# Patient Record
Sex: Male | Born: 1965
Health system: Southern US, Community
[De-identification: ages and names within clinical notes are randomized; demographics above are authoritative.]

## PROBLEM LIST (undated history)

## (undated) DIAGNOSIS — Z86010 Personal history of colonic polyps: Secondary | ICD-10-CM

## (undated) DIAGNOSIS — I10 Essential (primary) hypertension: Secondary | ICD-10-CM

## (undated) DIAGNOSIS — E785 Hyperlipidemia, unspecified: Secondary | ICD-10-CM

## (undated) DIAGNOSIS — T7840XA Allergy, unspecified, initial encounter: Secondary | ICD-10-CM

## (undated) DIAGNOSIS — B1910 Unspecified viral hepatitis B without hepatic coma: Secondary | ICD-10-CM

## (undated) DIAGNOSIS — F329 Major depressive disorder, single episode, unspecified: Secondary | ICD-10-CM

## (undated) DIAGNOSIS — B019 Varicella without complication: Secondary | ICD-10-CM

## (undated) DIAGNOSIS — R7989 Other specified abnormal findings of blood chemistry: Secondary | ICD-10-CM

## (undated) DIAGNOSIS — M5430 Sciatica, unspecified side: Secondary | ICD-10-CM

## (undated) DIAGNOSIS — R945 Abnormal results of liver function studies: Secondary | ICD-10-CM

## (undated) DIAGNOSIS — E669 Obesity, unspecified: Secondary | ICD-10-CM

## (undated) DIAGNOSIS — S3992XA Unspecified injury of lower back, initial encounter: Secondary | ICD-10-CM

## (undated) HISTORY — DX: Hyperlipidemia, unspecified: E78.5

## (undated) HISTORY — DX: Obesity, unspecified: E66.9

## (undated) HISTORY — PX: MOUTH SURGERY: SHX715

## (undated) HISTORY — DX: Major depressive disorder, single episode, unspecified: F32.9

## (undated) HISTORY — DX: Unspecified injury of lower back, initial encounter: S39.92XA

## (undated) HISTORY — PX: POLYPECTOMY: SHX149

## (undated) HISTORY — DX: Unspecified viral hepatitis B without hepatic coma: B19.10

## (undated) HISTORY — DX: Other specified abnormal findings of blood chemistry: R79.89

## (undated) HISTORY — DX: Essential (primary) hypertension: I10

## (undated) HISTORY — DX: Varicella without complication: B01.9

## (undated) HISTORY — DX: Sciatica, unspecified side: M54.30

## (undated) HISTORY — PX: COLONOSCOPY: SHX174

## (undated) HISTORY — DX: Abnormal results of liver function studies: R94.5

## (undated) HISTORY — DX: Personal history of colonic polyps: Z86.010

## (undated) HISTORY — DX: Allergy, unspecified, initial encounter: T78.40XA

---

## 1998-05-17 ENCOUNTER — Emergency Department (HOSPITAL_COMMUNITY): Admission: EM | Admit: 1998-05-17 | Discharge: 1998-05-17 | Payer: Self-pay | Admitting: Emergency Medicine

## 1998-12-20 ENCOUNTER — Emergency Department (HOSPITAL_COMMUNITY): Admission: EM | Admit: 1998-12-20 | Discharge: 1998-12-20 | Payer: Self-pay | Admitting: Emergency Medicine

## 2001-01-15 ENCOUNTER — Encounter: Admission: RE | Admit: 2001-01-15 | Discharge: 2001-01-15 | Payer: Self-pay | Admitting: Family Medicine

## 2002-02-06 ENCOUNTER — Ambulatory Visit (HOSPITAL_COMMUNITY): Admission: RE | Admit: 2002-02-06 | Discharge: 2002-02-06 | Payer: Self-pay | Admitting: Internal Medicine

## 2002-02-06 ENCOUNTER — Encounter: Payer: Self-pay | Admitting: Cardiovascular Disease

## 2003-06-07 DIAGNOSIS — F32A Depression, unspecified: Secondary | ICD-10-CM

## 2003-06-07 HISTORY — DX: Depression, unspecified: F32.A

## 2003-10-02 ENCOUNTER — Ambulatory Visit (HOSPITAL_COMMUNITY): Admission: RE | Admit: 2003-10-02 | Discharge: 2003-10-02 | Payer: Self-pay | Admitting: Cardiology

## 2004-06-09 ENCOUNTER — Emergency Department (HOSPITAL_COMMUNITY): Admission: EM | Admit: 2004-06-09 | Discharge: 2004-06-09 | Payer: Self-pay | Admitting: Emergency Medicine

## 2007-08-18 ENCOUNTER — Encounter: Admission: RE | Admit: 2007-08-18 | Discharge: 2007-08-18 | Payer: Self-pay | Admitting: Emergency Medicine

## 2009-04-28 ENCOUNTER — Emergency Department (HOSPITAL_COMMUNITY): Admission: EM | Admit: 2009-04-28 | Discharge: 2009-04-28 | Payer: Self-pay | Admitting: Emergency Medicine

## 2009-05-25 ENCOUNTER — Emergency Department (HOSPITAL_COMMUNITY): Admission: EM | Admit: 2009-05-25 | Discharge: 2009-05-25 | Payer: Self-pay | Admitting: Family Medicine

## 2010-05-26 ENCOUNTER — Emergency Department (HOSPITAL_COMMUNITY)
Admission: EM | Admit: 2010-05-26 | Discharge: 2010-05-26 | Payer: Self-pay | Source: Home / Self Care | Admitting: Emergency Medicine

## 2010-08-13 ENCOUNTER — Other Ambulatory Visit: Payer: Self-pay | Admitting: Internal Medicine

## 2010-08-13 DIAGNOSIS — M545 Low back pain, unspecified: Secondary | ICD-10-CM

## 2010-08-17 ENCOUNTER — Ambulatory Visit
Admission: RE | Admit: 2010-08-17 | Discharge: 2010-08-17 | Disposition: A | Discharge status: Home / Self Care | Payer: BC Managed Care – PPO | Source: Ambulatory Visit | Attending: Internal Medicine | Admitting: Internal Medicine

## 2010-08-17 DIAGNOSIS — M545 Low back pain, unspecified: Secondary | ICD-10-CM

## 2010-10-22 NOTE — Op Note (Signed)
NAMEGRANTLEY, SAVAGE NO.:  [id number]   MEDICAL RECORD NO.:  [id number]                   PATIENT TYPE:  OIB   LOCATION:  2899                                 FACILITY:  [facility]   PHYSICIAN:  Richard A. Alanda Amass, M.D.          DATE OF BIRTH:  11-Jun-1965   DATE OF PROCEDURE:  10/02/2003  DATE OF DISCHARGE:                                 OPERATIVE REPORT   PROCEDURE:  Tilt table test.   This 45 year old single half-pack a day smoker has had a history of  recurrent syncope for many years.  He has had two episodes in the last two  months of syncope and presyncope without seizure activity.  Cardiac workup  by Dr. Jacinto Halim has been negative so far except for mildly diminished ejection  fraction of approximately 50% on Cardiolite and 2 D echo with no significant  valvular disease.  He is referred for tilt table testing.  He is on no  regular medications.   The patient was in the postabsorptive state.  He was admitted as an  outpatient for tilt table testing and was brought to the second floor CP  lab.  Preoperative laboratory including CBC, coagulation studies, TSH, BMP,  were within normal limits.  Outpatient resting EKG showed sinus rhythm with  vertical frontal axis, normal QT, short PR interval, but no delta waves.  Essentially unremarkable for age.   The patient underwent upright tilt table testing at 70 degrees for 30  minutes.  There was no blood pressure drop or bradycardia or symptoms during  this time.   Isuprel infusion was then begun for another 10 minutes.  Blood pressure  increased to 153, heart rate increased to 85-90 at 70 degree tilt.  There  were no symptoms and no hypotension or bradycardia noted.   The patient tolerated the procedure well.   Negative tilt table test including Isuprel provocation as outlined above.                                               Richard A. Alanda Amass, M.D.    RAW/MEDQ  D:  10/02/2003  T:  10/02/2003   Job:  [id number]   cc:   Olene Craven, M.D.  [address]  [address]  [place]  [place] [address]  Fax: [phone]   Cristy Hilts. Jacinto Halim, M.D.  [address]  [place]  [place] [address]  Fax: [phone]

## 2013-01-28 ENCOUNTER — Other Ambulatory Visit: Payer: Self-pay | Admitting: Internal Medicine

## 2013-01-28 DIAGNOSIS — R1011 Right upper quadrant pain: Secondary | ICD-10-CM

## 2013-01-30 ENCOUNTER — Ambulatory Visit
Admission: RE | Admit: 2013-01-30 | Discharge: 2013-01-30 | Disposition: A | Discharge status: Home / Self Care | Payer: BC Managed Care – PPO | Source: Ambulatory Visit | Attending: Internal Medicine | Admitting: Internal Medicine

## 2013-01-30 DIAGNOSIS — R1011 Right upper quadrant pain: Secondary | ICD-10-CM

## 2013-03-07 ENCOUNTER — Encounter: Payer: Self-pay | Admitting: Internal Medicine

## 2013-03-11 ENCOUNTER — Ambulatory Visit (INDEPENDENT_AMBULATORY_CARE_PROVIDER_SITE_OTHER): Payer: No Typology Code available for payment source | Admitting: Internal Medicine

## 2013-03-11 ENCOUNTER — Encounter: Payer: Self-pay | Admitting: Internal Medicine

## 2013-03-11 VITALS — BP 110/80 | HR 68 | Ht 75.0 in | Wt 253.1 lb

## 2013-03-11 DIAGNOSIS — Z860100 Personal history of colon polyps, unspecified: Secondary | ICD-10-CM

## 2013-03-11 DIAGNOSIS — R7401 Elevation of levels of liver transaminase levels: Secondary | ICD-10-CM

## 2013-03-11 DIAGNOSIS — R748 Abnormal levels of other serum enzymes: Secondary | ICD-10-CM

## 2013-03-11 DIAGNOSIS — R195 Other fecal abnormalities: Secondary | ICD-10-CM

## 2013-03-11 HISTORY — DX: Personal history of colon polyps, unspecified: Z86.0100

## 2013-03-11 HISTORY — DX: Personal history of colonic polyps: Z86.010

## 2013-03-11 MED ORDER — NA SULFATE-K SULFATE-MG SULF 17.5-3.13-1.6 GM/177ML PO SOLN: ORAL | Status: DC

## 2013-03-11 NOTE — Assessment & Plan Note (Addendum)
[place] 01/2013 NL liver - not echogenic, NL GB and CBD Dr. Waynard Edwards suspects fatty liver as LFT's decreased markedly w/ wgt loss in past - he has ordered chronic hepatitis panel  This is appropriate - is important to f/u on that I would have him screened with an ANA also - he was sent for heme + stool and will f/u Dr. Waynard Edwards about this - I did explain that he should not overuse EtOH Would continue statin (consider checking CPK also)

## 2013-03-11 NOTE — Patient Instructions (Addendum)

## 2013-03-11 NOTE — Progress Notes (Signed)
  Subjective:    Patient ID: Javier Hart, male    DOB: 02-17-1966, 47 y.o.   MRN: [id number]  HPI The patient is here for evaluation of heme positive stool. His primary care physician, Dr. Waynard Edwards, had him do immune fecal occult blood testing and it came back positive this summer. He does not notice any types of bleeding or any problems like that. His other GI history is notable for abnormal transaminases. Dr. Waynard Edwards has been following this, apparently had a markedly reduction in transaminases last year when he lost weight. Allergies  Allergen Reactions  . Penicillins Anaphylaxis    As a child   Outpatient Prescriptions Prior to Visit  Medication Sig Dispense Refill  . Aluminum Chloride (DRYSOL EX) Apply topically. Liquid applied 2 times a month for hyperhidrosis.      . Cholecalciferol (VITAMIN D) 2000 UNITS CAPS Take by mouth.      . cyclobenzaprine (FLEXERIL) 10 MG tablet Take 10 mg by mouth 3 (three) times daily as needed for muscle spasms.      . rosuvastatin (CRESTOR) 20 MG tablet Take 20 mg by mouth daily.      Marland Kitchen zolpidem (AMBIEN) 10 MG tablet Take 10 mg by mouth at bedtime as needed for sleep.       No facility-administered medications prior to visit.   Past Medical History  Diagnosis Date  . Back injury   . Abnormal liver function test   . Depression 2005  . Sciatica     LLE  . Chicken pox     twice  . Hyperlipidemia   . Obesity    History reviewed. No pertinent past surgical history. History   Social History  . Marital Status: Single    Spouse Name: N/A    Number of Children: N/A  . Years of Education: N/A   Social History Main Topics  . Smoking status: Former Games developer  . Smokeless tobacco: Never Used  . Alcohol Use: Yes     Comment: wine and brown liquors in moderation  . 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 Brother is in internal medicine physician in [place].   No  children.   Up to one alcoholic beverage a day, 2 caffeinated beverages daily   Family History  Problem Relation Age of Onset  . Lung cancer Father   . Prostate cancer Father   . Breast cancer Mother     metastatic  . Bladder Cancer Brother   . Colon cancer Neg Hx   . Alcoholism Father     Review of Systems All other ROS negative or as per HPI    Objective:   Physical Exam General:  NAD Eyes:   anicteric Lungs:  clear Heart:  S1S2 no rubs, murmurs or gallops Abdomen:  soft and nontender, BS+, no HSM/mass  Data Reviewed:  PCP notes 01/2013 AST 70 (7-45) and ALT 119 (5-40) 01/09/13 PLT 177, WBC 5.1 TG 104    Assessment & Plan:   Heme + stool- iFOBT+   Abnormal transaminases

## 2013-03-11 NOTE — Assessment & Plan Note (Signed)
Colonoscopy is appropriate to investigate for cause of heme + stool in this setting. The risks and benefits as well as alternatives of endoscopic procedure(s) have been discussed and reviewed. All questions answered. The patient agrees to proceed.

## 2013-03-12 ENCOUNTER — Encounter: Payer: Self-pay | Admitting: Internal Medicine

## 2013-03-13 ENCOUNTER — Telehealth: Payer: Self-pay

## 2013-03-13 NOTE — Telephone Encounter (Signed)
Got a drug change request from [facility] saying suprep not available from the mfg.  Located a suprep sample kit , put up front for pick up and patient informed.  Spoke to [employer] the [employer] Rep. He spoke to pharmacy and they were not running it thru correctly, he will help them get this corrected.

## 2013-03-26 ENCOUNTER — Encounter: Payer: Self-pay | Admitting: Internal Medicine

## 2013-03-26 ENCOUNTER — Ambulatory Visit (AMBULATORY_SURGERY_CENTER): Payer: No Typology Code available for payment source | Admitting: Internal Medicine

## 2013-03-26 VITALS — BP 113/65 | HR 50 | Temp 97.5°F | Resp 18 | Ht 75.0 in | Wt 253.0 lb

## 2013-03-26 DIAGNOSIS — D126 Benign neoplasm of colon, unspecified: Secondary | ICD-10-CM

## 2013-03-26 DIAGNOSIS — R195 Other fecal abnormalities: Secondary | ICD-10-CM

## 2013-03-26 DIAGNOSIS — K648 Other hemorrhoids: Secondary | ICD-10-CM

## 2013-03-26 MED ORDER — SODIUM CHLORIDE 0.9 % IV SOLN: 500.0000 mL | INTRAVENOUS | Status: DC

## 2013-03-26 NOTE — Patient Instructions (Addendum)
I found and removed one small polyp that looks benign. You have internal hemorrhoids and I suspect this is what caused the microscopic blood in the stool.  I will let you know pathology results and when to have another routine colonoscopy by mail.  I recommend that you not do annual tests for blood in the stool given that you will likely be having routine colonoscopy.  It is the time of year to have a vaccination to prevent the flu (influenza virus). Please have this done through your primary care provider or you can get this done at local pharmacies or the [facility]. It would be very helpful if you notify your primary care provider when and where you had the vaccination given by messaging them in My Chart, leaving a message or faxing the information.  I appreciate the opportunity to care for you. Iva Boop, MD, FACG        YOU HAD AN ENDOSCOPIC PROCEDURE TODAY AT THE [facility]: Refer to the procedure report that was given to you for any specific questions about what was found during the examination.  If the procedure report does not answer your questions, please call your gastroenterologist to clarify.  If you requested that your care partner not be given the details of your procedure findings, then the procedure report has been included in a sealed envelope for you to review at your convenience later.  YOU SHOULD EXPECT: Some feelings of bloating in the abdomen. Passage of more gas than usual.  Walking can help get rid of the air that was put into your GI tract during the procedure and reduce the bloating. If you had a lower endoscopy (such as a colonoscopy or flexible sigmoidoscopy) you may notice spotting of blood in your stool or on the toilet paper. If you underwent a bowel prep for your procedure, then you may not have a normal bowel movement for a few days.  DIET: Your first meal following the procedure should be a light meal and then it is ok to progress to  your normal diet.  A half-sandwich or bowl of soup is an example of a good first meal.  Heavy or fried foods are harder to digest and may make you feel nauseous or bloated.  Likewise meals heavy in dairy and vegetables can cause extra gas to form and this can also increase the bloating.  Drink plenty of fluids but you should avoid alcoholic beverages for 24 hours.  ACTIVITY: Your care partner should take you home directly after the procedure.  You should plan to take it easy, moving slowly for the rest of the day.  You can resume normal activity the day after the procedure however you should NOT DRIVE or use heavy machinery for 24 hours (because of the sedation medicines used during the test).    SYMPTOMS TO REPORT IMMEDIATELY: A gastroenterologist can be reached at any hour.  During normal business hours, 8:30 AM to 5:00 PM Monday through Friday, call [phone].  After hours and on weekends, please call the GI answering service at [phone] who will take a message and have the physician on call contact you.   Following lower endoscopy (colonoscopy or flexible sigmoidoscopy):  Excessive amounts of blood in the stool  Significant tenderness or worsening of abdominal pains  Swelling of the abdomen that is new, acute  Fever of 100F or higher   FOLLOW UP: If any biopsies were taken you will be contacted by phone  or by letter within the next 1-3 weeks.  Call your gastroenterologist if you have not heard about the biopsies in 3 weeks.  Our staff will call the home number listed on your records the next business day following your procedure to check on you and address any questions or concerns that you may have at that time regarding the information given to you following your procedure. This is a courtesy call and so if there is no answer at the home number and we have not heard from you through the emergency physician on call, we will assume that you have returned to your regular daily  activities without incident.  SIGNATURES/CONFIDENTIALITY: You and/or your care partner have signed paperwork which will be entered into your electronic medical record.  These signatures attest to the fact that that the information above on your After Visit Summary has been reviewed and is understood.  Full responsibility of the confidentiality of this discharge information lies with you and/or your care-partner.    Resume medications. Information given on polyps and hemorrhoids with discharge instructions.

## 2013-03-26 NOTE — Op Note (Signed)
[facility] [address] [place], [address]   COLONOSCOPY PROCEDURE REPORT  PATIENT: Javier, Hart  MR#: [id number] BIRTHDATE: August 06, 1965 , 47  yrs. old GENDER: Male ENDOSCOPIST: Iva Boop, MD, [facility] REFERRED HY:QMVH Waynard Edwards, M.D. PROCEDURE DATE:  03/26/2013 PROCEDURE:   Colonoscopy with snare polypectomy First Screening Colonoscopy - Avg.  risk and is 50 yrs.  old or older - No.  Prior Negative Screening - Now for repeat screening. N/A  History of Adenoma - Now for follow-up colonoscopy & has been > or = to 3 yrs.  N/A  Polyps Removed Today? Yes. ASA CLASS:   Class II INDICATIONS:heme-positive stool.   IFOBT MEDICATIONS: propofol (Diprivan) 300mg  IV, MAC sedation, administered by CRNA, and These medications were titrated to patient response per physician's verbal order  DESCRIPTION OF PROCEDURE:   After the risks benefits and alternatives of the procedure were thoroughly explained, informed consent was obtained.  A digital rectal exam revealed no abnormalities of the rectum, A digital rectal exam revealed no prostatic nodules, and A digital rectal exam revealed the prostate was not enlarged.   The LB [id number]  endoscope was introduced through the anus and advanced to the cecum, which was identified by both the appendix and ileocecal valve. No adverse events experienced.   The quality of the prep was excellent using Suprep  The instrument was then slowly withdrawn as the colon was fully examined.   COLON FINDINGS: A sessile polyp measuring 6 mm in size was found in the transverse colon.  A polypectomy was performed with a cold snare.  The resection was complete and the polyp tissue was completely retrieved.   The colon mucosa was otherwise normal.   A right colon retroflexion was performed.  Retroflexed views revealed internal hemorrhoids. The time to cecum=2 minutes 52 seconds. Withdrawal time=8 minutes 07 seconds.  The scope was withdrawn and the  procedure completed. COMPLICATIONS: There were no complications.  ENDOSCOPIC IMPRESSION: 1.   Sessile polyp measuring 6 mm in size was found in the transverse colon; polypectomy was performed with a cold snare 2.   The colon mucosa was otherwise normal - excellent prep 3.   Internal hemorrhoids  RECOMMENDATIONS: 1.  Timing of repeat colonoscopy will be determined by pathology findings. 2.   Avoid routine hemoccults since he has had colonoscopy.   eSigned:  Iva Boop, MD, Clementeen Graham 03/26/2013 2:23 PM  cc: Rodrigo Ran, MD and The Patient

## 2013-03-26 NOTE — Progress Notes (Signed)
Called to room to assist during endoscopic procedure.  Patient ID and intended procedure confirmed with present staff. Received instructions for my participation in the procedure from the performing physician.  

## 2013-03-26 NOTE — Progress Notes (Signed)
No egg or soy allergy. ewm 

## 2013-03-26 NOTE — Progress Notes (Signed)
Patient did not experience any of the following events: a burn prior to discharge; a fall within the facility; wrong site/side/patient/procedure/implant event; or a hospital transfer or hospital admission upon discharge from the facility. (G8907) Patient did not have preoperative order for IV antibiotic SSI prophylaxis. (G8918)  

## 2013-03-27 ENCOUNTER — Telehealth: Payer: Self-pay | Admitting: *Deleted

## 2013-03-27 NOTE — Telephone Encounter (Signed)
Left message on number given in admitting to return call if questions or problems. ewm 

## 2013-04-05 ENCOUNTER — Encounter: Payer: Self-pay | Admitting: Internal Medicine

## 2013-04-05 NOTE — Progress Notes (Signed)
Quick Note:  6 mm tubular adenoma - repeat colonoscopy 2019 ______

## 2013-04-11 ENCOUNTER — Other Ambulatory Visit: Payer: Self-pay

## 2014-03-21 ENCOUNTER — Other Ambulatory Visit: Payer: Self-pay

## 2014-08-06 IMAGING — US US ABDOMEN COMPLETE
1 series · 14 of 25 positions shown · non-contrast
Comparison: None.

CLINICAL DATA: Right upper quadrant abdominal pain.

COMPLETE ABDOMINAL ULTRASOUND

[Series 1: us abdomen complete · 0.31mm/px · 14 of 67 slices shown]
[im 1/67]
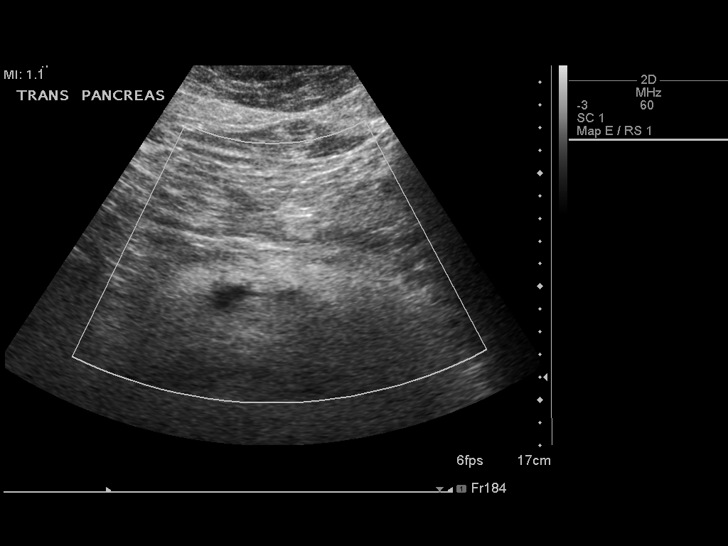
[im 6/67]
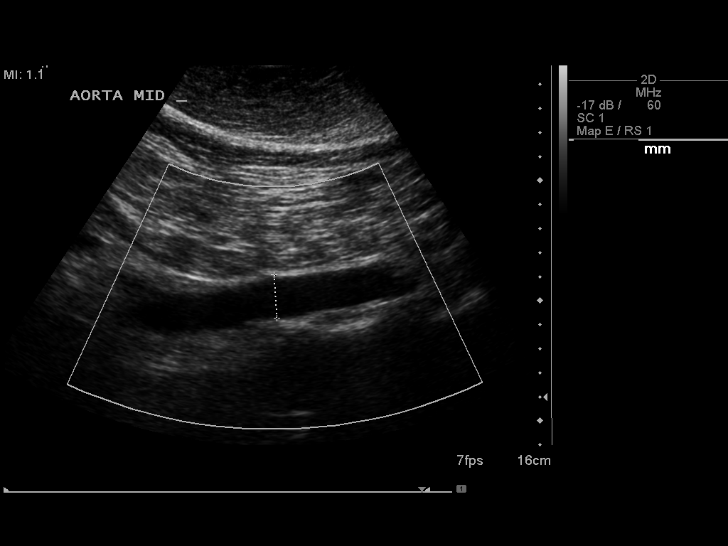
[im 12/67]
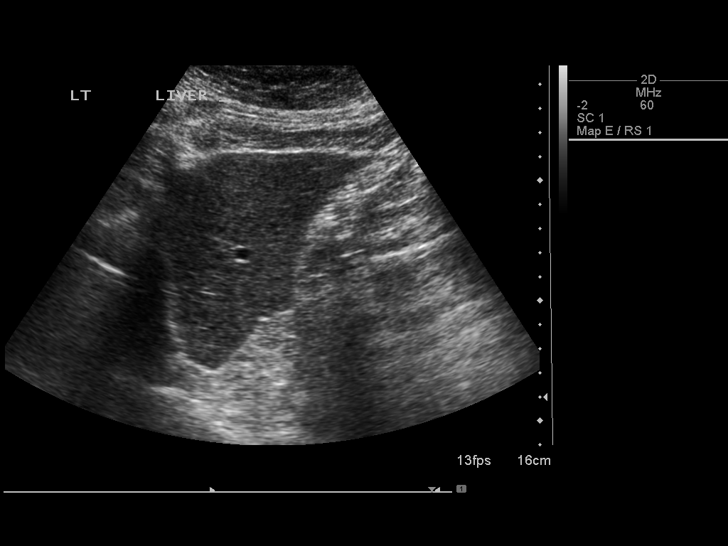
[im 17/67]
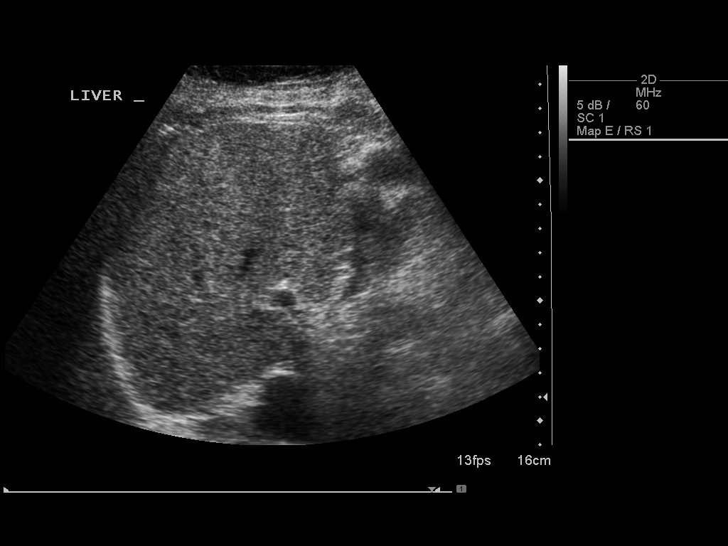
[im 23/67]
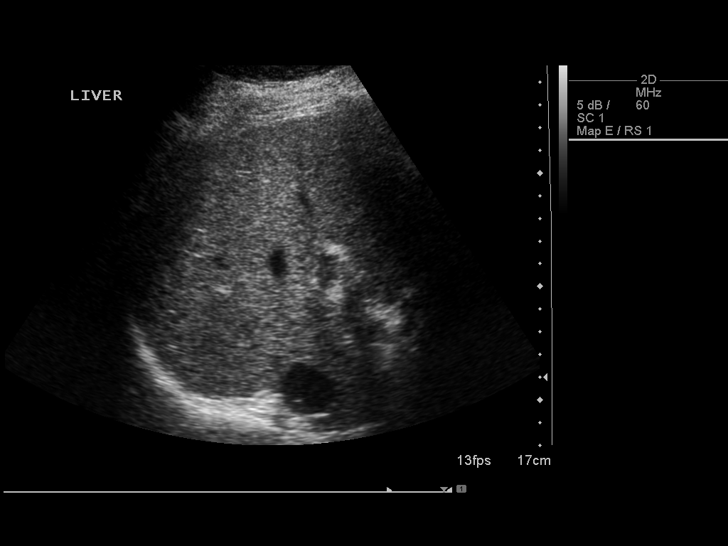
[im 25/67]
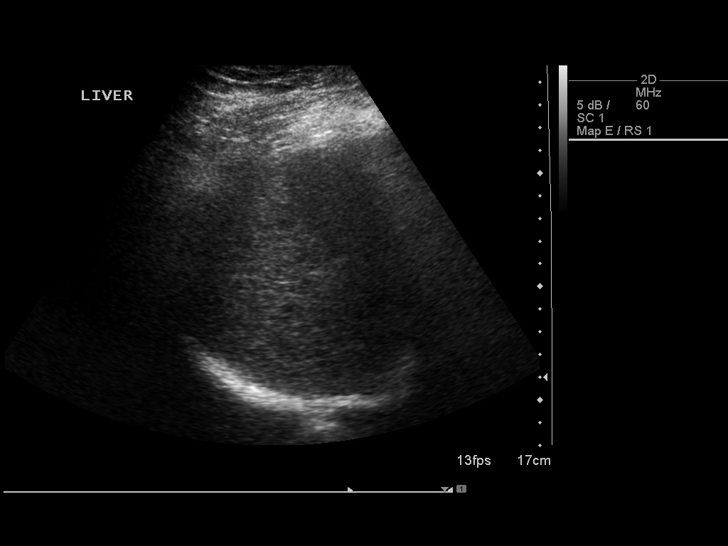
[im 31/67]
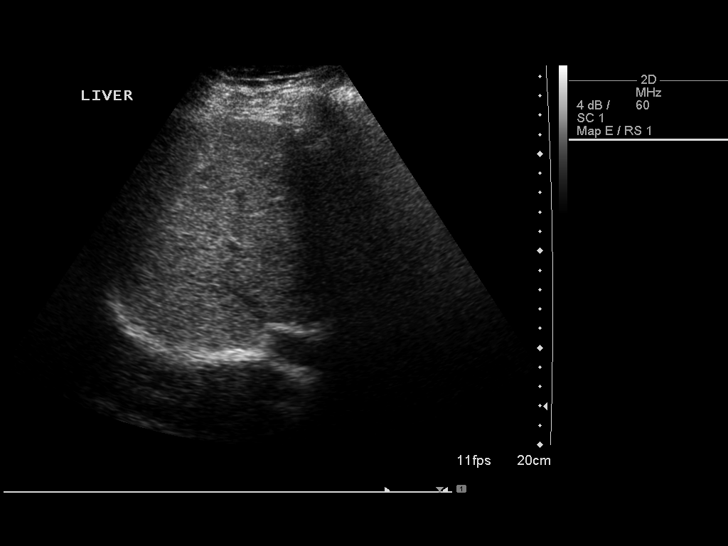
[im 36/67]
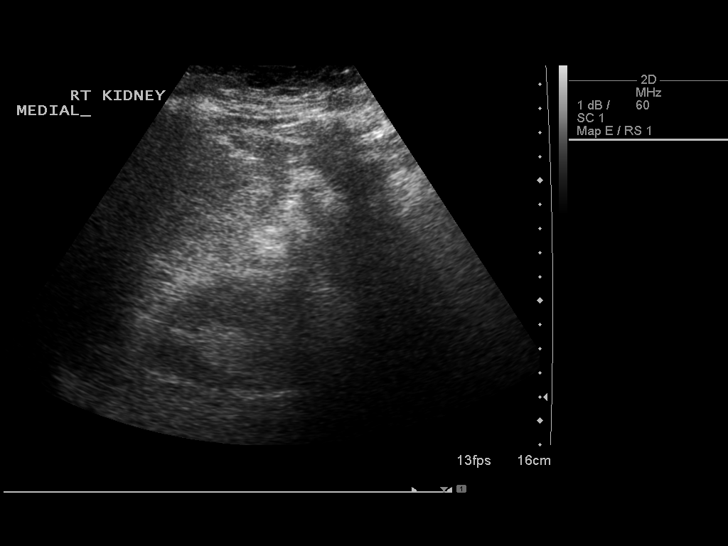
[im 42/67]
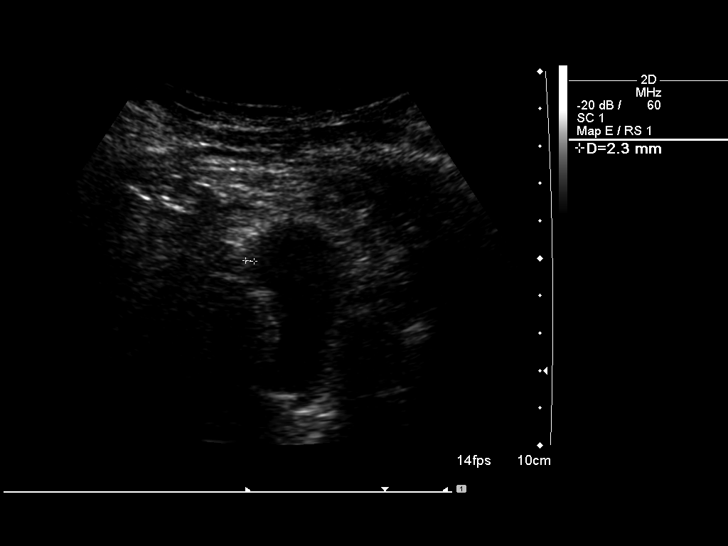
[im 45/67]
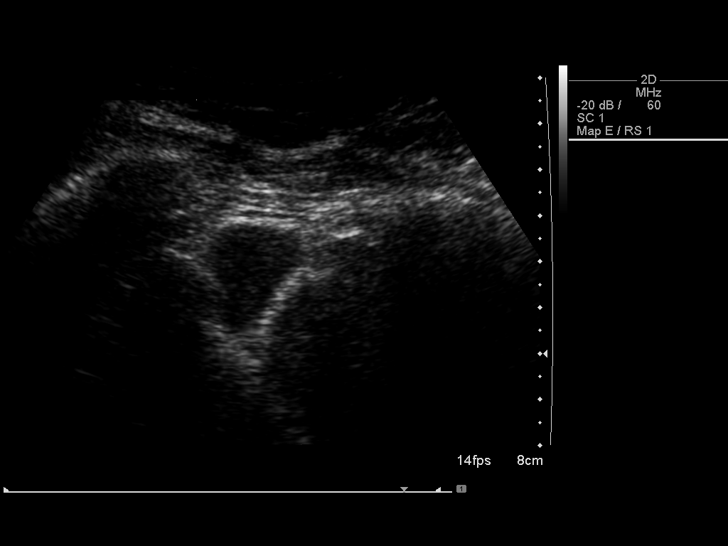
[im 50/67]
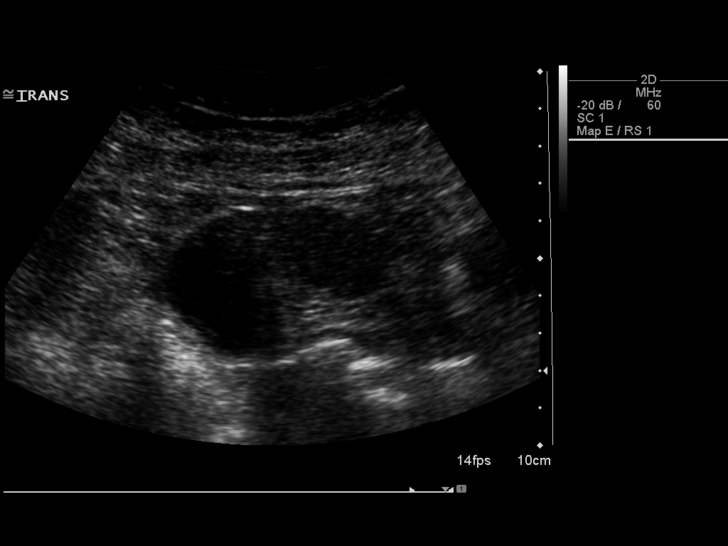
[im 56/67]
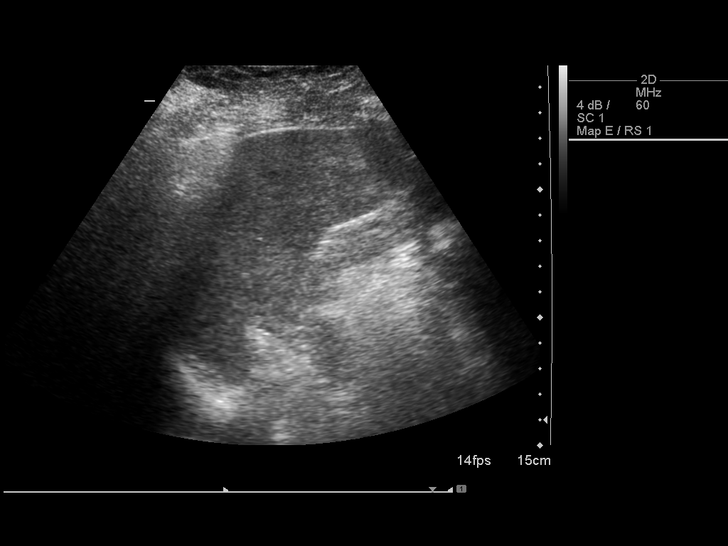
[im 61/67]
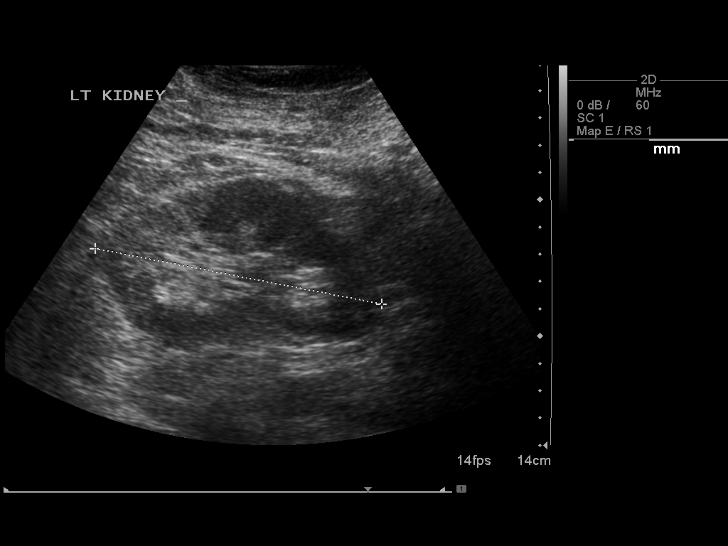
[im 67/67]
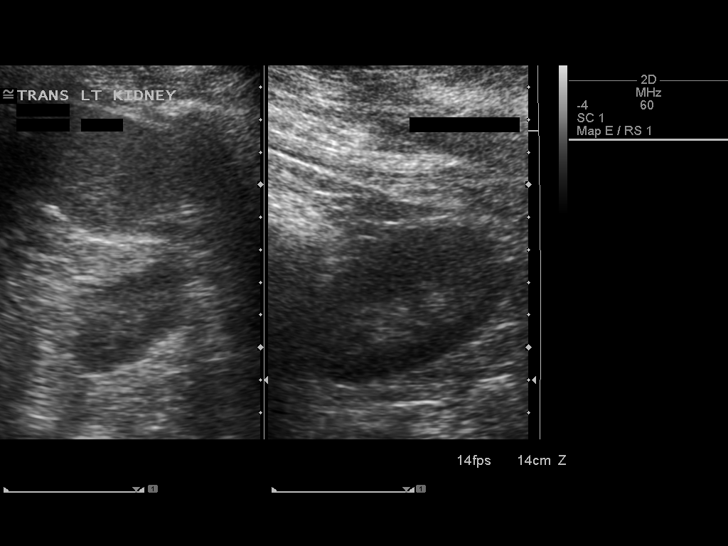

[14 of 25 positions shown; findings below may reference images not displayed]

FINDINGS: Gallbladder:  normal, without stone, wall thickening, or
      pericholecystic fluid.  Sonographic Murphy's sign was not
      elicited.

      Common bile duct:  normal, at 2 mm

      Liver:  normal in echogenicity without focal lesion.

      IVC:  within normal limits.

      Pancreas:  normal. Pancreatic tail not well visualized.

      Spleen:  normal in size and echotexture.

      Right Kidney:  11.4 cm.  No hydronephrosis.

      Left Kidney:  10.7 cm.  No hydronephrosis.

Abdominal aorta:  Non aneurysmal without ascites.
IMPRESSION: Normal abdominal ultrasound; no explanation for right upper
quadrant pain.

## 2017-03-09 DIAGNOSIS — M9901 Segmental and somatic dysfunction of cervical region: Secondary | ICD-10-CM | POA: Diagnosis not present

## 2017-03-09 DIAGNOSIS — M5432 Sciatica, left side: Secondary | ICD-10-CM | POA: Diagnosis not present

## 2017-03-09 DIAGNOSIS — M4716 Other spondylosis with myelopathy, lumbar region: Secondary | ICD-10-CM | POA: Diagnosis not present

## 2017-03-09 DIAGNOSIS — M9903 Segmental and somatic dysfunction of lumbar region: Secondary | ICD-10-CM | POA: Diagnosis not present

## 2017-04-06 DIAGNOSIS — M5432 Sciatica, left side: Secondary | ICD-10-CM | POA: Diagnosis not present

## 2017-04-06 DIAGNOSIS — M9901 Segmental and somatic dysfunction of cervical region: Secondary | ICD-10-CM | POA: Diagnosis not present

## 2017-04-06 DIAGNOSIS — M9903 Segmental and somatic dysfunction of lumbar region: Secondary | ICD-10-CM | POA: Diagnosis not present

## 2017-04-06 DIAGNOSIS — M4716 Other spondylosis with myelopathy, lumbar region: Secondary | ICD-10-CM | POA: Diagnosis not present

## 2017-04-18 DIAGNOSIS — H5203 Hypermetropia, bilateral: Secondary | ICD-10-CM | POA: Diagnosis not present

## 2017-04-18 DIAGNOSIS — H524 Presbyopia: Secondary | ICD-10-CM | POA: Diagnosis not present

## 2017-04-18 DIAGNOSIS — H04123 Dry eye syndrome of bilateral lacrimal glands: Secondary | ICD-10-CM | POA: Diagnosis not present

## 2017-05-11 DIAGNOSIS — M9901 Segmental and somatic dysfunction of cervical region: Secondary | ICD-10-CM | POA: Diagnosis not present

## 2017-05-11 DIAGNOSIS — M4716 Other spondylosis with myelopathy, lumbar region: Secondary | ICD-10-CM | POA: Diagnosis not present

## 2017-05-11 DIAGNOSIS — M5432 Sciatica, left side: Secondary | ICD-10-CM | POA: Diagnosis not present

## 2017-05-11 DIAGNOSIS — M9903 Segmental and somatic dysfunction of lumbar region: Secondary | ICD-10-CM | POA: Diagnosis not present

## 2017-07-07 DIAGNOSIS — R82998 Other abnormal findings in urine: Secondary | ICD-10-CM | POA: Diagnosis not present

## 2017-07-12 DIAGNOSIS — Z1212 Encounter for screening for malignant neoplasm of rectum: Secondary | ICD-10-CM | POA: Diagnosis not present

## 2017-07-14 DIAGNOSIS — L918 Other hypertrophic disorders of the skin: Secondary | ICD-10-CM | POA: Diagnosis not present

## 2017-07-14 DIAGNOSIS — R7301 Impaired fasting glucose: Secondary | ICD-10-CM | POA: Diagnosis not present

## 2017-07-14 DIAGNOSIS — Z0000 Encounter for general adult medical examination without abnormal findings: Secondary | ICD-10-CM | POA: Diagnosis not present

## 2017-07-14 DIAGNOSIS — E668 Other obesity: Secondary | ICD-10-CM | POA: Diagnosis not present

## 2017-07-14 DIAGNOSIS — R945 Abnormal results of liver function studies: Secondary | ICD-10-CM | POA: Diagnosis not present

## 2017-07-14 DIAGNOSIS — B1910 Unspecified viral hepatitis B without hepatic coma: Secondary | ICD-10-CM | POA: Diagnosis not present

## 2017-07-14 DIAGNOSIS — E7849 Other hyperlipidemia: Secondary | ICD-10-CM | POA: Diagnosis not present

## 2017-07-14 DIAGNOSIS — I10 Essential (primary) hypertension: Secondary | ICD-10-CM | POA: Diagnosis not present

## 2017-07-14 DIAGNOSIS — L308 Other specified dermatitis: Secondary | ICD-10-CM | POA: Diagnosis not present

## 2017-07-14 DIAGNOSIS — M205X9 Other deformities of toe(s) (acquired), unspecified foot: Secondary | ICD-10-CM | POA: Diagnosis not present

## 2017-07-19 DIAGNOSIS — M5432 Sciatica, left side: Secondary | ICD-10-CM | POA: Diagnosis not present

## 2017-07-19 DIAGNOSIS — M9901 Segmental and somatic dysfunction of cervical region: Secondary | ICD-10-CM | POA: Diagnosis not present

## 2017-07-19 DIAGNOSIS — M9903 Segmental and somatic dysfunction of lumbar region: Secondary | ICD-10-CM | POA: Diagnosis not present

## 2017-07-19 DIAGNOSIS — M9902 Segmental and somatic dysfunction of thoracic region: Secondary | ICD-10-CM | POA: Diagnosis not present

## 2017-07-19 DIAGNOSIS — M4716 Other spondylosis with myelopathy, lumbar region: Secondary | ICD-10-CM | POA: Diagnosis not present

## 2017-07-28 DIAGNOSIS — R52 Pain, unspecified: Secondary | ICD-10-CM | POA: Diagnosis not present

## 2017-07-28 DIAGNOSIS — R05 Cough: Secondary | ICD-10-CM | POA: Diagnosis not present

## 2017-08-25 DIAGNOSIS — B181 Chronic viral hepatitis B without delta-agent: Secondary | ICD-10-CM | POA: Diagnosis not present

## 2017-09-07 DIAGNOSIS — M9903 Segmental and somatic dysfunction of lumbar region: Secondary | ICD-10-CM | POA: Diagnosis not present

## 2017-09-07 DIAGNOSIS — M5432 Sciatica, left side: Secondary | ICD-10-CM | POA: Diagnosis not present

## 2017-09-07 DIAGNOSIS — M4716 Other spondylosis with myelopathy, lumbar region: Secondary | ICD-10-CM | POA: Diagnosis not present

## 2017-09-07 DIAGNOSIS — M9901 Segmental and somatic dysfunction of cervical region: Secondary | ICD-10-CM | POA: Diagnosis not present

## 2017-09-07 DIAGNOSIS — M9902 Segmental and somatic dysfunction of thoracic region: Secondary | ICD-10-CM | POA: Diagnosis not present

## 2017-10-18 DIAGNOSIS — Z23 Encounter for immunization: Secondary | ICD-10-CM | POA: Diagnosis not present

## 2017-11-20 DIAGNOSIS — M5432 Sciatica, left side: Secondary | ICD-10-CM | POA: Diagnosis not present

## 2017-11-20 DIAGNOSIS — M4716 Other spondylosis with myelopathy, lumbar region: Secondary | ICD-10-CM | POA: Diagnosis not present

## 2017-11-20 DIAGNOSIS — M9902 Segmental and somatic dysfunction of thoracic region: Secondary | ICD-10-CM | POA: Diagnosis not present

## 2017-11-20 DIAGNOSIS — M9901 Segmental and somatic dysfunction of cervical region: Secondary | ICD-10-CM | POA: Diagnosis not present

## 2017-11-20 DIAGNOSIS — M9903 Segmental and somatic dysfunction of lumbar region: Secondary | ICD-10-CM | POA: Diagnosis not present

## 2018-01-11 DIAGNOSIS — Z23 Encounter for immunization: Secondary | ICD-10-CM | POA: Diagnosis not present

## 2018-02-16 DIAGNOSIS — B181 Chronic viral hepatitis B without delta-agent: Secondary | ICD-10-CM | POA: Diagnosis not present

## 2018-02-22 DIAGNOSIS — Z23 Encounter for immunization: Secondary | ICD-10-CM | POA: Diagnosis not present

## 2018-06-28 ENCOUNTER — Encounter: Payer: Self-pay | Admitting: Internal Medicine

## 2018-07-04 DIAGNOSIS — H04123 Dry eye syndrome of bilateral lacrimal glands: Secondary | ICD-10-CM | POA: Diagnosis not present

## 2018-07-04 DIAGNOSIS — H524 Presbyopia: Secondary | ICD-10-CM | POA: Diagnosis not present

## 2018-07-04 DIAGNOSIS — H5203 Hypermetropia, bilateral: Secondary | ICD-10-CM | POA: Diagnosis not present

## 2018-07-09 ENCOUNTER — Encounter: Payer: Self-pay | Admitting: Internal Medicine

## 2018-08-20 ENCOUNTER — Ambulatory Visit (AMBULATORY_SURGERY_CENTER): Payer: Self-pay | Admitting: *Deleted

## 2018-08-20 ENCOUNTER — Encounter: Payer: Self-pay | Admitting: Internal Medicine

## 2018-08-20 ENCOUNTER — Other Ambulatory Visit: Payer: Self-pay

## 2018-08-20 VITALS — Temp 98.0°F | Ht 75.0 in | Wt 203.0 lb

## 2018-08-20 DIAGNOSIS — Z860100 Personal history of colon polyps, unspecified: Secondary | ICD-10-CM

## 2018-08-20 NOTE — Progress Notes (Signed)
No egg or soy allergy known to patient  No issues with past sedation with any surgeries  or procedures, no intubation problems  No diet pills per patient No home 02 use per patient  No blood thinners per patient  Pt denies issues with constipation  No A fib or A flutter  EMMI video sent to pt's e mail - declined   

## 2018-08-24 DIAGNOSIS — B181 Chronic viral hepatitis B without delta-agent: Secondary | ICD-10-CM | POA: Diagnosis not present

## 2018-08-30 ENCOUNTER — Telehealth: Payer: Self-pay | Admitting: *Deleted

## 2018-08-30 NOTE — Telephone Encounter (Signed)
Spoke with patient and advised him that his procedure is cancelled for 3/30 d/t covid 19 restrictions.  Advised him that we would call back to reschedule.

## 2018-09-03 ENCOUNTER — Encounter: Payer: No Typology Code available for payment source | Admitting: Internal Medicine

## 2018-10-03 DIAGNOSIS — Z0000 Encounter for general adult medical examination without abnormal findings: Secondary | ICD-10-CM | POA: Diagnosis not present

## 2018-10-03 DIAGNOSIS — R82998 Other abnormal findings in urine: Secondary | ICD-10-CM | POA: Diagnosis not present

## 2018-10-03 DIAGNOSIS — Z125 Encounter for screening for malignant neoplasm of prostate: Secondary | ICD-10-CM | POA: Diagnosis not present

## 2018-10-03 DIAGNOSIS — I10 Essential (primary) hypertension: Secondary | ICD-10-CM | POA: Diagnosis not present

## 2018-10-03 DIAGNOSIS — R7301 Impaired fasting glucose: Secondary | ICD-10-CM | POA: Diagnosis not present

## 2018-10-10 ENCOUNTER — Telehealth: Payer: Self-pay | Admitting: *Deleted

## 2018-10-10 DIAGNOSIS — R7301 Impaired fasting glucose: Secondary | ICD-10-CM | POA: Diagnosis not present

## 2018-10-10 DIAGNOSIS — Z0000 Encounter for general adult medical examination without abnormal findings: Secondary | ICD-10-CM | POA: Diagnosis not present

## 2018-10-10 DIAGNOSIS — E785 Hyperlipidemia, unspecified: Secondary | ICD-10-CM | POA: Diagnosis not present

## 2018-10-10 DIAGNOSIS — R945 Abnormal results of liver function studies: Secondary | ICD-10-CM | POA: Diagnosis not present

## 2018-10-10 DIAGNOSIS — B1910 Unspecified viral hepatitis B without hepatic coma: Secondary | ICD-10-CM | POA: Diagnosis not present

## 2018-10-10 DIAGNOSIS — G4761 Periodic limb movement disorder: Secondary | ICD-10-CM | POA: Diagnosis not present

## 2018-10-10 DIAGNOSIS — Z1331 Encounter for screening for depression: Secondary | ICD-10-CM | POA: Diagnosis not present

## 2018-10-10 DIAGNOSIS — M205X9 Other deformities of toe(s) (acquired), unspecified foot: Secondary | ICD-10-CM | POA: Diagnosis not present

## 2018-10-10 NOTE — Telephone Encounter (Signed)
Called pt to reschedule postponed appointment.  Rescheduled for 5/20 @ 1030.  Will send new prep instructions via My Chart.

## 2018-10-11 ENCOUNTER — Telehealth: Payer: Self-pay | Admitting: Neurology

## 2018-10-11 NOTE — Telephone Encounter (Signed)
Due to current COVID 19 pandemic, our office is severely reducing in office visits, in order to minimize the risk to our patients and healthcare providers.    Pt understands that although there may be some limitations with this type of visit, we will take all precautions to reduce any security or privacy concerns.  Pt understands that this will be treated like an in office visit and we will file with pt's insurance, and there may be a patient responsible charge related to this service.   Pt's email is [email or web address]. Pt will be using [employer] for their virtual visit. Pt understands that the nurse will be calling to go over pt's chart.

## 2018-10-16 ENCOUNTER — Encounter: Payer: Self-pay | Admitting: Neurology

## 2018-10-16 NOTE — Addendum Note (Signed)
Addended by: Darleen Crocker on: 10/16/2018 08:19 AM   Modules accepted: Orders

## 2018-10-16 NOTE — Telephone Encounter (Signed)

## 2018-10-17 ENCOUNTER — Other Ambulatory Visit: Payer: Self-pay

## 2018-10-17 ENCOUNTER — Ambulatory Visit (INDEPENDENT_AMBULATORY_CARE_PROVIDER_SITE_OTHER): Payer: 59 | Admitting: Neurology

## 2018-10-17 ENCOUNTER — Encounter: Payer: Self-pay | Admitting: Neurology

## 2018-10-17 DIAGNOSIS — G4701 Insomnia due to medical condition: Secondary | ICD-10-CM | POA: Diagnosis not present

## 2018-10-17 DIAGNOSIS — G4750 Parasomnia, unspecified: Secondary | ICD-10-CM

## 2018-10-17 DIAGNOSIS — R0683 Snoring: Secondary | ICD-10-CM | POA: Diagnosis not present

## 2018-10-17 DIAGNOSIS — G4761 Periodic limb movement disorder: Secondary | ICD-10-CM

## 2018-10-17 DIAGNOSIS — R748 Abnormal levels of other serum enzymes: Secondary | ICD-10-CM | POA: Diagnosis not present

## 2018-10-17 NOTE — Progress Notes (Signed)
Virtual Visit via Video Note  I connected with Javier Hart on 10/17/18 at  2:00 PM EDT by a video enabled telemedicine application and verified that I am speaking with the correct person using two identifiers.  Location: Patient: at home with spouse  Provider:at [facility]    I discussed the limitations of evaluation and management by telemedicine and the availability of in person appointments. The patient expressed understanding and agreed to proceed.   SLEEP MEDICINE CLINIC   Provider:  Larey Seat, MD   Primary Care Physician:  Crist Infante, MD   Referring Provider: Crist Infante, MD     HPI:  Javier Hart is a 53 y.o. male , seen here  in a referral by video, on 10-17-2018 - upon referral by  Dr. Joylene Draft for PLMs/ RLS.   Chief complaint according to patient : The patient and his spouse Javier Hart feel that he is a restless sleeper, constantly moving incessantly kicking or as his partner describes it, "dancing" while asleep.  Sleep /medical history Javier Hart is a 53 year old Caucasian right-handed gentleman who appears younger than his noted age.  Carries a diagnosis of hypertension, probable periodic limb movements, recurrent coughing, hepatitis B followed at [facility], abnormal liver function test, dermatitis, insomnia.  He reports having a history of lower back pain and sciatica and a left foot injury that left him with nerve damage and numbness and dysesthesia.  He used to be followed by Dr. Ernestene Kiel, he had been evaluated for fainting spells and in the past underwent tilt table testing in 2004 through 2005, dx as vasovagal-  he was overweight for a while but in 2018 lost enough to achieve a body mass index under 25.  He suffered a back injury 2007 at a fundraising dinner party when he missed a step.  Myofascial massage and chiropractic treatments have helped the left extremity has occasional sciatica some numbness and the big toe could not be fully extended.  Peroneal nerve damage had been found in a  nerve conduction test in the past.  Remarkably is that the patient has no hyperlipidemia, no diabetes, no hypertension, no dry eye or dry mouth issue.  He reports some excessive sweating but this is not related to sleep.   Family medical and sleep history: The patient's father died of prostate carcinoma with metastasis to the thorax at age 66 he was had and a history of alcohol abuse.  The mother died at age 13 with breast cancer.  The patient is the youngest of 7 children 3 boys and 3 girls he was born in both parents were well in their 67s and his oldest brother was 50.   Social history: Lives with the same gender spouse and is full-time gainfully employed.  His job is mainly local [employer] and he is in his senior leadership position- he has to be available 24/7, he reports.  He quit 13 years ago smoking he may drink alcohol 2 glasses weekly reports that he made a conscious effort to drink less while  the coronavirus self-isolation started. He drinks coffee in the morning maybe 2 sometimes 3 cups but no soda, no tea and no energy drinks.  He does not consume caffeinated beverages after lunch.    Sleep habits are as follows:  Shares a bedroom with her spouse, who noted kicking, constant movements turning and tossing for the last 6 or 7 years maybe longer.  His spouse also thinks that alcohol made movements worse at night.  The patient  reports that his dinnertime is between 630 and 7 PM, bedtime between 10 and 10:30 PM and he is usually asleep between 1030 and 10:45 PM.  He describes the marital bedroom is cool, quiet and dark and while he is quickly asleep he seems to be a very restless sleeper tossing and turning movement incessantly.  This his spouse has believed is worse when he had alcohol at dinnertime.  He takes Ambien every night for the last 4 years he prefers to sleep on the left on 2 pillows on a flat bed with a firm mattress.  His pelvis will be prone but his thorax turned to the left.  He snores  loudly if he finds himself supine.  He wakes up spontaneously at 5 AM but stays in bed until 6 AM with a nocturnal sleep time of 6 maybe 6-1/2 hours.  The patient cannot recall dreaming he does not have palpitations no headaches rarely any discomfort or pain he uses a bite guard at night as he chews in his sleep but he is not grinding her teeth.  He feels refreshed when he wakes up spontaneously and often experiences a certain clarity of mind when he is just awoken and we can solve problems or schedule through his day before leaving the bedroom.  He feels refreshed and restored without headaches.  He denies a dry mouth.      Review of Systems: Out of a complete 14 system review, the patient complains of only the following symptoms, and all other reviewed systems are negative.  RLS tossing turning, Ambien dependent.   12/ 24   Social History   Socioeconomic History   Marital status: Single    Spouse name: Not on file   Number of children: Not on file   Years of education: Not on file   Highest education level: Not on file  Occupational History   Not on file  Social Needs   Financial resource strain: Not on file   Food insecurity:    Worry: Not on file    Inability: Not on file   Transportation needs:    Medical: Not on file    Non-medical: Not on file  Tobacco Use   Smoking status: Former Smoker   Smokeless tobacco: Never Used  Substance and Sexual Activity   Alcohol use: Yes    Comment: wine and brown liquors in moderation   Drug use: No   Sexual activity: Not on file  Lifestyle   Physical activity:    Days per week: Not on file    Minutes per session: Not on file   Stress: Not on file  Relationships   Social connections:    Talks on phone: Not on file    Gets together: Not on file    Attends religious service: Not on file    Active member of club or organization: Not on file    Attends meetings of clubs or organizations: Not on file    Relationship  status: Not on file   Intimate partner violence:    Fear of current or ex partner: Not on file    Emotionally abused: Not on file    Physically abused: Not on file    Forced sexual activity: Not on file  Other Topics Concern   Not on file  Social History Narrative   Married, 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   No children.   Up to  one alcoholic beverage a day, 2 caffeinated beverages daily    Family History  Problem Relation Age of Onset   Lung cancer Father    Prostate cancer Father    Alcoholism Father    Breast cancer Mother        metastatic   Bladder Cancer Brother    Colon cancer Neg Hx    Colon polyps Neg Hx    Esophageal cancer Neg Hx    Rectal cancer Neg Hx    Stomach cancer Neg Hx     Past Medical History:  Diagnosis Date   Abnormal liver function test    Allergy    Back injury    Chicken pox    twice   Depression 2005   Hepatitis B    not active    Hyperlipidemia    Hypertension    past hx- weight loss   Obesity    Personal history of colonic adenoma 03/11/2013   Sciatica    LLE    Past Surgical History:  Procedure Laterality Date   COLONOSCOPY     MOUTH SURGERY     age 61   POLYPECTOMY      Current Outpatient Medications  Medication Sig Dispense Refill   Cholecalciferol (VITAMIN D) 2000 UNITS CAPS Take by mouth.     rosuvastatin (CRESTOR) 20 MG tablet Take 20 mg by mouth daily.     tenofovir (VIREAD) 300 MG tablet Take by mouth.     zolpidem (AMBIEN) 10 MG tablet Take 10 mg by mouth at bedtime as needed for sleep.     No current facility-administered medications for this visit.     Allergies as of 10/17/2018 - Review Complete 10/16/2018  Allergen Reaction Noted   Penicillins Anaphylaxis 03/11/2013    Vitals: There were no vitals taken for this visit. Last Weight:  Wt Readings from Last 1 Encounters:  08/20/18 203  lb (92.1 kg)   Last Height:   Ht Readings from Last 1 Encounters:  08/20/18 6\' 3"  (1.905 m)   0 = not likely, 1 = slight chance, 2 = moderate chance, 3 = high chance  Sitting and Reading? Watching Television? Sitting inactive in a public place (theater or meeting)? Lying down in the afternoon when circumstances permit? Sitting and talking to someone? Sitting quietly after lunch without alcohol? In a car, while stopped for a few minutes in traffic? As a passenger in a car for an hour without a break?  Total = 12/ 24      Social History   Socioeconomic History   Marital status: Single    Spouse name: Not on file   Number of children: Not on file   Years of education: Not on file   Highest education level: Not on file  Occupational History   Not on file  Social Needs   Financial resource strain: Not on file   Food insecurity:    Worry: Not on file    Inability: Not on file   Transportation needs:    Medical: Not on file    Non-medical: Not on file  Tobacco Use   Smoking status: Former Smoker   Smokeless tobacco: Never Used  Substance and Sexual Activity   Alcohol use: Yes    Comment: wine and brown liquors in moderation   Drug use: No   Sexual activity: Not on file  Lifestyle   Physical activity:    Days per week: Not on file    Minutes per session: Not  on file   Stress: Not on file  Relationships   Social connections:    Talks on phone: Not on file    Gets together: Not on file    Attends religious service: Not on file    Active member of club or organization: Not on file    Attends meetings of clubs or organizations: Not on file    Relationship status: Not on file   Intimate partner violence:    Fear of current or ex partner: Not on file    Emotionally abused: Not on file    Physically abused: Not on file    Forced sexual activity: Not on file  Other Topics Concern   Not on file  Social History Narrative   Married, 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   No children.   Up to one alcoholic beverage a day, 2 caffeinated beverages daily    Family History  Problem Relation Age of Onset   Lung cancer Father    Prostate cancer Father    Alcoholism Father    Breast cancer Mother        metastatic   Bladder Cancer Brother    Colon cancer Neg Hx    Colon polyps Neg Hx    Esophageal cancer Neg Hx    Rectal cancer Neg Hx    Stomach cancer Neg Hx     Past Medical History:  Diagnosis Date   Abnormal liver function test    Allergy    Back injury    Chicken pox    twice   Depression 2005   Hepatitis B    not active    Hyperlipidemia    Hypertension    past hx- weight loss   Obesity    Personal history of colonic adenoma 03/11/2013   Sciatica    LLE    Past Surgical History:  Procedure Laterality Date   COLONOSCOPY     MOUTH SURGERY     age 81   POLYPECTOMY      Current Outpatient Medications  Medication Sig Dispense Refill   Cholecalciferol (VITAMIN D) 2000 UNITS CAPS Take by mouth.     rosuvastatin (CRESTOR) 20 MG tablet Take 20 mg by mouth daily.     tenofovir (VIREAD) 300 MG tablet Take by mouth.     zolpidem (AMBIEN) 10 MG tablet Take 10 mg by mouth at bedtime as needed for sleep.     No current facility-administered medications for this visit.     Allergies as of 10/17/2018 - Review Complete 10/16/2018  Allergen Reaction Noted   Penicillins Anaphylaxis 03/11/2013    Vitals: There were no vitals taken for this visit. Last Weight:  Wt Readings from Last 1 Encounters:  08/20/18 203 lb (92.1 kg)   JSE:GBTDV is no height or weight on file to calculate BMI.     Last Height:   Ht Readings from Last 1 Encounters:  08/20/18 6\' 3"  (1.905 m)    Physical exam:  General: The patient is awake, alert and appears not in acute distress. The patient is cooperative, eloquent and well  groomed. Head: Normocephalic, atraumatic. Neck is supple. Mallampati 2  neck circumference:15. 25 ". Nasal airflow patent , Retrognathia is not seen.  Cardiovascular:   without distended neck veins. Respiratory: patient held his breath for 35 seconds.  Skin:  Without evidence of edema, or rash Trunk: BMI is normal-. The patient's posture is erect  Neurologic exam : The patient is awake and alert, oriented to place and time.    Attention span & concentration ability appears normal.  Speech is fluent,  without dysarthria, dysphonia or aphasia.  Mood and affect are appropriate.  Cranial nerves: Pupils are equal and briskly reactive to light. Extraocular movements  in vertical and horizontal planes intact and without nystagmus.  Facial motor strength is symmetric and tongue and uvula move midline. Shoulder shrug was symmetrical.   Motor exam:  symmetric ROM in all extremities.  Sensory:  Fine touch, pinprick and vibration were deferred.  Coordination: Rapid alternating movements in the fingers/hands  normal without evidence of ataxia, dysmetria or tremor.  Gait and station: Patient walks without assistive device  . Strength within normal limits.  Stance is stable and normal.    Assessment and Plan:  Main concern is constant moving in sleep, tossing, turning, and the irresistible need to continue movement in order to go to sleep.   He uses Ambien daily for 2-3 years now.   There has been some snoring witnessed by his spouse when he resumes the supine sleep position. He has never choked or felt any breath holding, gasping. He has not woken with a dry mouth.   Follow Up Instructions: attended sleep study needed. We are looking for PLMs, RLS and myoclonus. Peripheral nerve damage to the left foot has been present and Lower back pain with a slipped disc was noted about 10 years ago.     I discussed the assessment and treatment plan with the patient. The patient was provided an opportunity  to ask questions and all were answered. The patient agreed with the plan and demonstrated an understanding of the instructions.   The patient was advised to call back or seek an in-person evaluation if the symptoms worsen or if the condition fails to improve as anticipated.  I provided 25 minutes of non-face-to-face time during this encounter. Rv in 1-2 month face to face with labs.  Ferritin, TIBC and free iron will be checked, CKMB and TSH.    Larey Seat, MD    Larey Seat, MD 9/70/2637, 8:58 PM  Certified in Neurology by ABPN Certified in Sleep Medicine by [facility] [address], [place], [place] [address]

## 2018-10-22 ENCOUNTER — Telehealth: Payer: Self-pay | Admitting: *Deleted

## 2018-10-22 NOTE — Telephone Encounter (Signed)
No answer for first attempt covid screening will call back. SM

## 2018-10-22 NOTE — Telephone Encounter (Signed)
No answer

## 2018-10-24 ENCOUNTER — Ambulatory Visit (AMBULATORY_SURGERY_CENTER): Payer: 59 | Admitting: Internal Medicine

## 2018-10-24 ENCOUNTER — Encounter: Payer: Self-pay | Admitting: Internal Medicine

## 2018-10-24 ENCOUNTER — Other Ambulatory Visit: Payer: Self-pay

## 2018-10-24 VITALS — BP 103/55 | HR 55 | Temp 97.6°F | Resp 14 | Ht 75.0 in | Wt 203.0 lb

## 2018-10-24 DIAGNOSIS — Z1211 Encounter for screening for malignant neoplasm of colon: Secondary | ICD-10-CM | POA: Diagnosis not present

## 2018-10-24 DIAGNOSIS — Z86010 Personal history of colonic polyps: Secondary | ICD-10-CM | POA: Diagnosis not present

## 2018-10-24 MED ORDER — SODIUM CHLORIDE 0.9 % IV SOLN: 500.0000 mL | Freq: Once | INTRAVENOUS | Status: AC

## 2018-10-24 NOTE — Progress Notes (Signed)
Pt's states no medical or surgical changes since previsit or office visit.  Temp- Courtney V/S- Bethena Roys

## 2018-10-24 NOTE — Patient Instructions (Addendum)
No polyps today - so next colonoscopy in 10 years based upon the guidelines.  I appreciate the opportunity to care for you. Gatha Mayer, MD, FACG YOU HAD AN ENDOSCOPIC PROCEDURE TODAY AT [facility]:   Refer to the procedure report that was given to you for any specific questions about what was found during the examination.  If the procedure report does not answer your questions, please call your gastroenterologist to clarify.  If you requested that your care partner not be given the details of your procedure findings, then the procedure report has been included in a sealed envelope for you to review at your convenience later.  YOU SHOULD EXPECT: Some feelings of bloating in the abdomen. Passage of more gas than usual.  Walking can help get rid of the air that was put into your GI tract during the procedure and reduce the bloating. If you had a lower endoscopy (such as a colonoscopy or flexible sigmoidoscopy) you may notice spotting of blood in your stool or on the toilet paper. If you underwent a bowel prep for your procedure, you may not have a normal bowel movement for a few days.  Please Note:  You might notice some irritation and congestion in your nose or some drainage.  This is from the oxygen used during your procedure.  There is no need for concern and it should clear up in a day or so.  SYMPTOMS TO REPORT IMMEDIATELY:   Following lower endoscopy (colonoscopy or flexible sigmoidoscopy):  Excessive amounts of blood in the stool  Significant tenderness or worsening of abdominal pains  Swelling of the abdomen that is new, acute  Fever of 100F or higher  For urgent or emergent issues, a gastroenterologist can be reached at any hour by calling [phone].   DIET:  We do recommend a small meal at first, but then you may proceed to your regular diet.  Drink plenty of fluids but you should avoid alcoholic beverages for 24 hours.  ACTIVITY:  You should plan to take  it easy for the rest of today and you should NOT DRIVE or use heavy machinery until tomorrow (because of the sedation medicines used during the test).    FOLLOW UP: Our staff will call the number listed on your records 48-72 hours following your procedure to check on you and address any questions or concerns that you may have regarding the information given to you following your procedure. If we do not reach you, we will leave a message.  We will attempt to reach you two times.  During this call, we will ask if you have developed any symptoms of COVID 19. If you develop any symptoms (for example fever, flu-like symptoms, shortness of breath, cough etc.) before then, please call [phone].  If any biopsies were taken you will be contacted by phone or by letter within the next 1-3 weeks.  Please call us at [phone] if you have not heard about the biopsies in 3 weeks.    SIGNATURES/CONFIDENTIALITY: You and/or your care partner have signed paperwork which will be entered into your electronic medical record.  These signatures attest to the fact that that the information above on your After Visit Summary has been reviewed and is understood.  Full responsibility of the confidentiality of this discharge information lies with you and/or your care-partner.

## 2018-10-24 NOTE — Op Note (Signed)
[name] Name: Javier Hart Procedure Date: 10/24/2018 10:24 AM MRN: [id number] Endoscopist: Gatha Mayer , MD Age: 53 Referring MD:  Date of Birth: 03-31-1966 Gender: Male Account #: [id number] Procedure:                Colonoscopy Indications:              Surveillance: Personal history of adenomatous                            polyps on last colonoscopy > 5 years ago Medicines:                Propofol per Anesthesia, Monitored Anesthesia Care Procedure:                Pre-Anesthesia Assessment:                           - Prior to the procedure, a History and Physical                            was performed, and patient medications and                            allergies were reviewed. The patient's tolerance of                            previous anesthesia was also reviewed. The risks                            and benefits of the procedure and the sedation                            options and risks were discussed with the patient.                            All questions were answered, and informed consent                            was obtained. Prior Anticoagulants: The patient has                            taken no previous anticoagulant or antiplatelet                            agents. ASA Grade Assessment: II - A patient with                            mild systemic disease. After reviewing the risks                            and benefits, the patient was deemed in                            satisfactory condition to undergo the procedure.  After obtaining informed consent, the colonoscope                            was passed under direct vision. Throughout the                            procedure, the patient's blood pressure, pulse, and                            oxygen saturations were monitored continuously. The                            Colonoscope was introduced through the anus and                             advanced to the the cecum, identified by                            appendiceal orifice and ileocecal valve. The                            colonoscopy was performed without difficulty. The                            patient tolerated the procedure well. The quality                            of the bowel preparation was excellent. The                            ileocecal valve, appendiceal orifice, and rectum                            were photographed. The bowel preparation used was                            Miralax via split dose instruction. Scope In: 10:38:18 AM Scope Out: 10:53:16 AM Scope Withdrawal Time: 0 hours 10 minutes 33 seconds  Total Procedure Duration: 0 hours 14 minutes 58 seconds  Findings:                 The entire examined colon appeared normal on direct                            and retroflexion views. Complications:            No immediate complications. Estimated Blood Loss:     Estimated blood loss: none. Impression:               - The entire examined colon is normal on direct and                            retroflexion views.                           - No  specimens collected.                           - Personal history of colonic polyps. Recommendation:           - Patient has a contact number available for                            emergencies. The signs and symptoms of potential                            delayed complications were discussed with the                            patient. Return to normal activities tomorrow.                            Written discharge instructions were provided to the                            patient.                           - Resume previous diet.                           - Continue present medications.                           - Repeat colonoscopy in 10 years.                           - Repeat colonoscopy in 10 days for screening                            purposes. Gatha Mayer, MD 10/24/2018 11:06:29  AM This report has been signed electronically.

## 2018-10-24 NOTE — Progress Notes (Signed)
To PACU, VSS. Report to [email or web address] 

## 2018-10-25 ENCOUNTER — Other Ambulatory Visit (INDEPENDENT_AMBULATORY_CARE_PROVIDER_SITE_OTHER): Payer: Self-pay

## 2018-10-25 DIAGNOSIS — R748 Abnormal levels of other serum enzymes: Secondary | ICD-10-CM | POA: Diagnosis not present

## 2018-10-25 DIAGNOSIS — R0683 Snoring: Secondary | ICD-10-CM | POA: Diagnosis not present

## 2018-10-25 DIAGNOSIS — G4761 Periodic limb movement disorder: Secondary | ICD-10-CM | POA: Diagnosis not present

## 2018-10-25 DIAGNOSIS — G4701 Insomnia due to medical condition: Secondary | ICD-10-CM | POA: Diagnosis not present

## 2018-10-25 DIAGNOSIS — Z0289 Encounter for other administrative examinations: Secondary | ICD-10-CM

## 2018-10-26 LAB — IRON,TIBC AND FERRITIN PANEL
Ferritin: 207 ng/mL (ref 30–400)
Iron Saturation: 21 % (ref 15–55)
Iron: 64 ug/dL (ref 38–169)
Total Iron Binding Capacity: 306 ug/dL (ref 250–450)
UIBC: 242 ug/dL (ref 111–343)

## 2018-10-30 ENCOUNTER — Telehealth: Payer: Self-pay

## 2018-10-30 ENCOUNTER — Encounter: Payer: Self-pay | Admitting: Neurology

## 2018-10-30 NOTE — Telephone Encounter (Signed)
  Follow up Call-  Call back number 10/24/2018  Post procedure Call Back phone  # [phone]  Permission to leave phone message Yes  Some recent data might be hidden     Patient questions:  Do you have a fever, pain , or abdominal swelling? No. Pain Score  0 *  Have you tolerated food without any problems? Yes.    Have you been able to return to your normal activities? Yes.    Do you have any questions about your discharge instructions: Diet   No. Medications  No. Follow up visit  No.  Do you have questions or concerns about your Care? No.  Actions: * If pain score is 4 or above: No action needed, pain <4. 1. Have you developed a fever since your procedure? no  2.   Have you had an respiratory symptoms (SOB or cough) since your procedure? no  3.   Have you tested positive for COVID 19 since your procedure no  4.   Have you had any family members/close contacts diagnosed with the COVID 19 since your procedure?  no   If any of these questions are a yes, please inquire if patient has been seen by family doctor and route this note to Joylene John, Therapist, sports.

## 2018-11-19 ENCOUNTER — Other Ambulatory Visit: Payer: Self-pay | Admitting: Neurology

## 2018-11-19 ENCOUNTER — Telehealth: Payer: Self-pay

## 2018-11-19 DIAGNOSIS — R0683 Snoring: Secondary | ICD-10-CM

## 2018-11-19 DIAGNOSIS — G4750 Parasomnia, unspecified: Secondary | ICD-10-CM

## 2018-11-19 DIAGNOSIS — G4701 Insomnia due to medical condition: Secondary | ICD-10-CM

## 2018-11-19 NOTE — Telephone Encounter (Signed)
[employer] denied in lab sleep study despite restless legs. Need HST order

## 2018-11-19 NOTE — Telephone Encounter (Signed)
Order placed for HST 

## 2018-12-12 ENCOUNTER — Ambulatory Visit (INDEPENDENT_AMBULATORY_CARE_PROVIDER_SITE_OTHER): Payer: 59 | Admitting: Neurology

## 2018-12-12 DIAGNOSIS — G4701 Insomnia due to medical condition: Secondary | ICD-10-CM

## 2018-12-12 DIAGNOSIS — R0683 Snoring: Secondary | ICD-10-CM

## 2018-12-12 DIAGNOSIS — G4750 Parasomnia, unspecified: Secondary | ICD-10-CM

## 2018-12-12 DIAGNOSIS — G4733 Obstructive sleep apnea (adult) (pediatric): Secondary | ICD-10-CM | POA: Diagnosis not present

## 2018-12-17 ENCOUNTER — Encounter: Payer: Self-pay | Admitting: Neurology

## 2018-12-18 NOTE — Procedures (Signed)
Patient Information     First Name: Javier Hart ID: [id number]  Birth Date: 08-30-1965 Age: 53 Gender: Male  Referring Provider Elta Guadeloupe Perini,MD BMI: 25.5 (W=203 lb, H=6' 3'')  Neck Circ.:  15 '' Epworth:  12/24   Sleep Study Information    Study Date: Dec 12, 2018 S/H/A Version: 001.001.001.001 / 4.1.1528 / 57  History:     Javier Hart is a 53 y.o. male, seen here in a referral by video, on 10-17-2018 - upon referral by  Dr. Joylene Draft for PLMs/ RLS. The patient and his spouse Javier Hart feel that he is a restless sleeper, constantly moving incessantly kicking or as his partner describes it, "dancing" while asleep. Mr. Mccamish is a 53 year old Caucasian right-handed gentleman who appears younger than his noted age.  Carries a diagnosis of hypertension, probable periodic limb movements, recurrent coughing, and hepatitis B followed at [facility], abnormal liver function test, dermatitis, and insomnia.  He reports having a history of lower back pain and sciatica and a left foot injury that left him with nerve damage and numbness and dysesthesia.      Summary & Diagnosis:      Mild OSA at AHI 5.6 /h with bradycardia, but without hypoxemia noted. Exacerbated in supine sleep position to AHI of 8.8/h and in REM sleep to 8.6/h. There is loud snoring noted ( RDI) .   Recommendations:     I recommend treatment of mild apnea for hypersomnia patients but the HST fails to document the patient's main clinical concern - his complex movements, possibly enactment of dreams, his reported PLMs.  I doubt that treatment of such mild apnea is effective therapy for the above named symptoms, aside from hypersomnia.  By AASM guidelines I will have to initiate treatment of apnea first, re-evaluate the patient after 30 days by Epworth Sleepiness Score and then follow with an attended sleep study if symptoms persist. I have ordered an auto titration capable CPAP device.   Physician Name:  Asencion Partridge Edvardo Honse,MD                 Sleep Summary  Oxygen Saturation Statistics   Start Study Time: End Study Time: Total Recording Time:        10:07:30PM      5:42:29 AM 7 h, 57min  Total Sleep Time % REM of Sleep Time:  6 h, 19 min  15.4    Mean: 97 Minimum: 89 Maximum: 100  Mean of Desaturations Nadirs (%):   93  Oxygen Desaturation. %:   4-9 10-20 >20 Total  Events Number Total    18  1 94.7 5.3  0 0.0  19 100.0  Oxygen Saturation: <90 <=88 <85 <80 <70  Duration (minutes): Sleep % 0.0 0.0  0.0 0.0  0.0 0.0 0.0 0.0 0.0 0.0     Respiratory Indices      Total Events REM NREM All Night  pRDI:  108  pAHI:  35 ODI:  19  pAHIc:  3  % CSR: 0.0 21.4 8.6 6.4 3.2 16.5 5.1 2.4 0.0 17.2 5.6 3.0 0.5       Pulse Rate Statistics during Sleep (BPM)      Mean: 51 Minimum: 36 Maximum: 98    Indices are calculated using technically valid sleep time of  6 hrs, 16 min. Central-Indices are calculated using technically valid sleep time of  5  hrs, 50 min. pRDI/pAHI are calculated using oxi desaturations ? 3% Sit Body Position Statistics  Position  Supine Prone Right Left Non-Supine  Sleep (min) 144.5 23.0 116.5 96.0 235.5  Sleep % 38.0 6.1 30.7 25.3 62.0  pRDI 17.2 26.3 11.9 21.7 17.3  pAHI 8.8 2.6 3.1 4.5 3.6  ODI 5.4 0.0 1.6 1.9 1.6     Snoring Statistics Snoring Level (dB) >40 >50 >60 >70 >80 >Threshold (45)  Sleep (min) 43.9 2.0 0.5 0.0 0.0 4.8  Sleep % 11.5 0.5 0.1 0.0 0.0 1.3    Mean: 40 dB Sleep Stages Chart

## 2018-12-18 NOTE — Addendum Note (Signed)
Addended by: Larey Seat on: 12/18/2018 04:47 PM   Modules accepted: Orders

## 2018-12-19 ENCOUNTER — Encounter: Payer: Self-pay | Admitting: Neurology

## 2018-12-19 ENCOUNTER — Telehealth: Payer: Self-pay | Admitting: Neurology

## 2018-12-19 NOTE — Telephone Encounter (Signed)
-----   Message from Larey Seat, MD sent at 12/18/2018  4:47 PM EDT ----- Mild OSA at AHI 5.6 /h with bradycardia, but without hypoxemia noted. Exacerbated in supine sleep position to AHI of 8.8/h and in REM sleep to 8.6/h. There is loud snoring noted ( RDI) .   Recommendations:    I recommend treatment of mild apnea for hypersomnia patients but the HST fails to document the patient's main clinical concern - his complex movements, possibly enactment of dreams, his reported PLMs.  I doubt that treatment of such mild apnea is effective therapy for the above named symptoms, aside from hypersomnia.  By AASM guidelines I will have to initiate treatment of apnea first, re-evaluate the patient after 30 days by Epworth Sleepiness Score and then follow with an attended sleep study if symptoms persist. I have ordered an auto titration capable CPAP device.   Physician Name:  Larey Seat, MD

## 2018-12-19 NOTE — Telephone Encounter (Signed)
Called patient to discuss sleep study results. No answer at this time. LVM for the patient to call back.  Ill send a mychart message also

## 2018-12-26 DIAGNOSIS — G4733 Obstructive sleep apnea (adult) (pediatric): Secondary | ICD-10-CM | POA: Diagnosis not present

## 2019-01-26 DIAGNOSIS — G4733 Obstructive sleep apnea (adult) (pediatric): Secondary | ICD-10-CM | POA: Diagnosis not present

## 2019-02-15 DIAGNOSIS — Z1289 Encounter for screening for malignant neoplasm of other sites: Secondary | ICD-10-CM | POA: Diagnosis not present

## 2019-02-15 DIAGNOSIS — B181 Chronic viral hepatitis B without delta-agent: Secondary | ICD-10-CM | POA: Diagnosis not present

## 2019-02-16 DIAGNOSIS — Z23 Encounter for immunization: Secondary | ICD-10-CM | POA: Diagnosis not present

## 2019-02-19 DIAGNOSIS — Z20828 Contact with and (suspected) exposure to other viral communicable diseases: Secondary | ICD-10-CM | POA: Diagnosis not present

## 2019-02-20 ENCOUNTER — Encounter: Payer: Self-pay | Admitting: Neurology

## 2019-02-20 ENCOUNTER — Ambulatory Visit (INDEPENDENT_AMBULATORY_CARE_PROVIDER_SITE_OTHER): Payer: 59 | Admitting: Neurology

## 2019-02-20 ENCOUNTER — Other Ambulatory Visit: Payer: Self-pay

## 2019-02-20 VITALS — BP 134/80 | HR 72 | Temp 98.4°F | Ht 75.0 in | Wt 193.0 lb

## 2019-02-20 DIAGNOSIS — Z9989 Dependence on other enabling machines and devices: Secondary | ICD-10-CM | POA: Diagnosis not present

## 2019-02-20 DIAGNOSIS — R0683 Snoring: Secondary | ICD-10-CM

## 2019-02-20 DIAGNOSIS — G4733 Obstructive sleep apnea (adult) (pediatric): Secondary | ICD-10-CM

## 2019-02-20 NOTE — Progress Notes (Signed)
I discussed the limitations of evaluation and management by telemedicine and the availability of in person appointments. The patient expressed understanding and agreed to proceed.   SLEEP MEDICINE CLINIC   Provider:  Larey Seat, MD   Primary Care Physician:  Crist Infante, MD   Referring Provider: Crist Infante, MD     Javier Hart is a 53 y.o. male seen in a sleep study follow up, 02-20-2019.   Sleep study result from [place] on 12-12-2018, Mild OSA at AHI 5.6 /h with bradycardia, but without hypoxemia noted. Exacerbated in supine sleep position to AHI of 8.8/h and in REM sleep to 8.6/h. There is loud snoring noted ( RDI) .     I recommend treatment of mild apnea for hypersomnia patients but the HST fails to document the patients main clinical concern - his complex movements, possibly enactment of dreams, his reported PLMs. I doubt that treatment of such mild apnea is effective therapy for the above named symptoms, aside from hypersomnia. By AASM guidelines I will have to initiate treatment of apnea first, re-evaluate the patient after 30 days by Epworth Sleepiness Score and then follow with an attended sleep study if symptoms persist. I have ordered an auto titration capable CPAP device.   The patient has since used auto CPAP- we are meeting today to see if an attended sleep study is still in consideration.  His spouse, Jeani Hawking,  has noted much less tossing and turning, elimination of snoring, and correction of apnea. He feels better rested, less sleepy and has more energy in daytime. He has noted the effect on alcohol on his sleep pattern and sleep quality.  The patient's compliance record is excellent he has used the machine 30 out of 30 days and 25 days over 4 hours consecutively, the average user time for all days of the last 30 days was 6 hours and 12 minutes, is using an AutoSet air sense 10 by [employer].  The minimum pressure is 5 the maximum pressure of 12 cmH2O the 3 cm expiratory  pressure relief 95th percentile pressure is 7.4 which is well covered in the current settings residual AHI is only 1.4.  There are equal central and obstructive apneas noted in the residual apnea index.  Air leak is mild to moderate, nasal pillow.    HPI:  Javier Hart is a 53 y.o. male , seen here  in a referral by video, on 10-17-2018 - upon referral by  Dr. Joylene Draft for PLMs/ RLS.   Chief complaint according to patient : The patient and his spouse Jeani Hawking feel that he is a restless sleeper, constantly moving incessantly kicking or as his partner describes it, "dancing" while asleep.  Sleep /medical history [name] is a 53 year old Caucasian right-handed gentleman who appears younger than his noted age.  Carries a diagnosis of hypertension, probable periodic limb movements, recurrent coughing, hepatitis B followed at [facility], abnormal liver function test, dermatitis, insomnia.  He reports having a history of lower back pain and sciatica and a left foot injury that left him with nerve damage and numbness and dysesthesia.  He used to be followed by Dr. Ernestene Kiel, he had been evaluated for fainting spells and in the past underwent tilt table testing in 2004 through 2005, dx as vasovagal-  he was overweight for a while but in 2018 lost enough to achieve a body mass index under 25.   He suffered a back injury 2007 at a fundraising dinner party when he missed a step.  Myofascial massage and chiropractic treatments have helped the left extremity has occasional sciatica some numbness and the big toe could not be fully extended.  Peroneal nerve damage had been found in a nerve conduction test in the past.  Remarkably is that the patient has no hyperlipidemia, no diabetes, no hypertension, no dry eye or dry mouth issue.  He reports some excessive sweating but this is not related to sleep.   Family medical and sleep history: The patient's father died of prostate carcinoma with metastasis to the thorax at age 46 he was had  and a history of alcohol abuse.  The mother died at age 16 with breast cancer.  The patient is the youngest of 7 children 3 boys and 3 girls he was born in both parents were well in their 42s and his oldest brother was 59.   Social history: Lives with the same gender spouse and is full-time gainfully employed.  His job is mainly local [employer] and he is in his senior leadership position- he has to be available 24/7, he reports.  He quit 13 years ago smoking he may drink alcohol 2 glasses weekly reports that he made a conscious effort to drink less while  the coronavirus self-isolation started. He drinks coffee in the morning maybe 2 sometimes 3 cups but no soda, no tea and no energy drinks.  He does not consume caffeinated beverages after lunch.    Sleep habits are as follows:  Shares a bedroom with her spouse, who noted kicking, constant movements turning and tossing for the last 6 or 7 years maybe longer.  His spouse also thinks that alcohol made movements worse at night.  The patient reports that his dinnertime is between 630 and 7 PM, bedtime between 10 and 10:30 PM and he is usually asleep between 1030 and 10:45 PM.  He describes the marital bedroom is cool, quiet and dark and while he is quickly asleep he seems to be a very restless sleeper tossing and turning movement incessantly.  This his spouse has believed is worse when he had alcohol at dinnertime.  He takes Ambien every night for the last 4 years he prefers to sleep on the left on 2 pillows on a flat bed with a firm mattress.  His pelvis will be prone but his thorax turned to the left.  He snores loudly if he finds himself supine.  He wakes up spontaneously at 5 AM but stays in bed until 6 AM with a nocturnal sleep time of 6 maybe 6-1/2 hours.   The patient cannot recall dreaming. He does not have palpitations/ no headaches/ rarely any discomfort or pain.  She uses a bite guard at night as he chews in his sleep but he is not grinding her teeth.     He feels refreshed when he wakes up spontaneously and often experiences a certain clarity of mind when he is just awoken and we can solve problems or schedule through his day before leaving the bedroom.  He feels refreshed and restored without headaches. He is waking up early- and walks in AM, gets p at 5 AM..  He denies a dry mouth.     Review of Systems: Out of a complete 14 system review, the patient complains of only the following symptoms, and all other reviewed systems are negative.  RLS tossing turning, Ambien dependent.   How likely are you to doze in the following situations: 0 = not likely, 1 = slight chance, 2 = moderate  chance, 3 = high chance  Sitting and Reading? Watching Television? Sitting inactive in a public place (theater or meeting)? Lying down in the afternoon when circumstances permit? Sitting and talking to someone? Sitting quietly after lunch without alcohol? In a car, while stopped for a few minutes in traffic? As a passenger in a car for an hour without a break?  Total = now 3/ 24 points. Down by 5 points.   FSS N/A   He feels his sleep and his PLMs have improved.   Social History   Socioeconomic History   Marital status: Single    Spouse name: Not on file   Number of children: Not on file   Years of education: Not on file   Highest education level: Not on file  Occupational History   Not on file  Social Needs   Financial resource strain: Not on file   Food insecurity    Worry: Not on file    Inability: Not on file   Transportation needs    Medical: Not on file    Non-medical: Not on file  Tobacco Use   Smoking status: Former Smoker   Smokeless tobacco: Never Used  Substance and Sexual Activity   Alcohol use: Yes    Comment: wine and brown liquors in moderation   Drug use: No   Sexual activity: Not on file  Lifestyle   Physical activity    Days per week: Not on file    Minutes per session: Not on file   Stress: Not on  file  Relationships   Social connections    Talks on phone: Not on file    Gets together: Not on file    Attends religious service: Not on file    Active member of club or organization: Not on file    Attends meetings of clubs or organizations: Not on file    Relationship status: Not on file   Intimate partner violence    Fear of current or ex partner: Not on file    Emotionally abused: Not on file    Physically abused: Not on file    Forced sexual activity: Not on file  Other Topics Concern   Not on file  Social History Narrative   Married, 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   No children.   Up to one alcoholic beverage a day, 2 caffeinated beverages daily    Family History  Problem Relation Age of Onset   Lung cancer Father    Prostate cancer Father    Alcoholism Father    Breast cancer Mother        metastatic   Bladder Cancer Brother    Colon cancer Neg Hx    Colon polyps Neg Hx    Esophageal cancer Neg Hx    Rectal cancer Neg Hx    Stomach cancer Neg Hx     Past Medical History:  Diagnosis Date   Abnormal liver function test    Allergy    Back injury    Chicken pox    twice   Depression 2005   Hepatitis B    not active    Hyperlipidemia    Hypertension    past hx- weight loss   Obesity    Personal history of colonic adenoma 03/11/2013   Sciatica    LLE    Past Surgical History:  Procedure Laterality Date   COLONOSCOPY  MOUTH SURGERY     age 58   POLYPECTOMY      Current Outpatient Medications  Medication Sig Dispense Refill   Cholecalciferol (VITAMIN D) 2000 UNITS CAPS Take by mouth.     rosuvastatin (CRESTOR) 20 MG tablet Take 20 mg by mouth daily.     tenofovir (VIREAD) 300 MG tablet Take by mouth.     zolpidem (AMBIEN) 10 MG tablet Take 10 mg by mouth at bedtime as needed for sleep.     Current Facility-Administered  Medications  Medication Dose Route Frequency Provider Last Rate Last Dose   0.9 %  sodium chloride infusion  500 mL Intravenous Once Gatha Mayer, MD        Allergies as of 02/20/2019 - Review Complete 02/20/2019  Allergen Reaction Noted   Penicillins Anaphylaxis 03/11/2013    Vitals: BP 134/80    Pulse 72    Temp 98.4 F (36.9 C)    Ht 6\' 3"  (1.905 m)    Wt 193 lb (87.5 kg)    BMI 24.12 kg/m  Last Weight:  Wt Readings from Last 1 Encounters:  02/20/19 193 lb (87.5 kg)   Last Height:   Ht Readings from Last 1 Encounters:  02/20/19 6\' 3"  (1.905 m)   0 = not likely, 1 = slight chance, 2 = moderate chance, 3 = high chance  Sitting and Reading? Watching Television? Sitting inactive in a public place (theater or meeting)? Lying down in the afternoon when circumstances permit? Sitting and talking to someone? Sitting quietly after lunch without alcohol? In a car, while stopped for a few minutes in traffic? As a passenger in a car for an hour without a break?  Total = 12/ 24      Social History   Socioeconomic History   Marital status: Single    Spouse name: Not on file   Number of children: Not on file   Years of education: Not on file   Highest education level: Not on file  Occupational History   Not on file  Social Needs   Financial resource strain: Not on file   Food insecurity    Worry: Not on file    Inability: Not on file   Transportation needs    Medical: Not on file    Non-medical: Not on file  Tobacco Use   Smoking status: Former Smoker   Smokeless tobacco: Never Used  Substance and Sexual Activity   Alcohol use: Yes    Comment: wine and brown liquors in moderation   Drug use: No   Sexual activity: Not on file  Lifestyle   Physical activity    Days per week: Not on file    Minutes per session: Not on file   Stress: Not on file  Relationships   Social connections    Talks on phone: Not on file    Gets together: Not on file      Attends religious service: Not on file    Active member of club or organization: Not on file    Attends meetings of clubs or organizations: Not on file    Relationship status: Not on file   Intimate partner violence    Fear of current or ex partner: Not on file    Emotionally abused: Not on file    Physically abused: Not on file    Forced sexual activity: Not on file  Other Topics Concern   Not on file  Social History Narrative   Married,  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   No children.   Up to one alcoholic beverage a day, 2 caffeinated beverages daily    Family History  Problem Relation Age of Onset   Lung cancer Father    Prostate cancer Father    Alcoholism Father    Breast cancer Mother        metastatic   Bladder Cancer Brother    Colon cancer Neg Hx    Colon polyps Neg Hx    Esophageal cancer Neg Hx    Rectal cancer Neg Hx    Stomach cancer Neg Hx     Past Medical History:  Diagnosis Date   Abnormal liver function test    Allergy    Back injury    Chicken pox    twice   Depression 2005   Hepatitis B    not active    Hyperlipidemia    Hypertension    past hx- weight loss   Obesity    Personal history of colonic adenoma 03/11/2013   Sciatica    LLE    Past Surgical History:  Procedure Laterality Date   COLONOSCOPY     MOUTH SURGERY     age 57   POLYPECTOMY      Current Outpatient Medications  Medication Sig Dispense Refill   Cholecalciferol (VITAMIN D) 2000 UNITS CAPS Take by mouth.     rosuvastatin (CRESTOR) 20 MG tablet Take 20 mg by mouth daily.     tenofovir (VIREAD) 300 MG tablet Take by mouth.     zolpidem (AMBIEN) 10 MG tablet Take 10 mg by mouth at bedtime as needed for sleep.     Current Facility-Administered Medications  Medication Dose Route Frequency Provider Last Rate Last Dose   0.9 %  sodium chloride infusion   500 mL Intravenous Once Gatha Mayer, MD        Allergies as of 02/20/2019 - Review Complete 02/20/2019  Allergen Reaction Noted   Penicillins Anaphylaxis 03/11/2013    Vitals: BP 134/80    Pulse 72    Temp 98.4 F (36.9 C)    Ht 6\' 3"  (1.905 m)    Wt 193 lb (87.5 kg)    BMI 24.12 kg/m  Last Weight:  Wt Readings from Last 1 Encounters:  02/20/19 193 lb (87.5 kg)   [id number] mass index is 24.12 kg/m.     Last Height:   Ht Readings from Last 1 Encounters:  02/20/19 6\' 3"  (1.905 m)    Physical exam:  General: The patient is awake, alert and appears not in acute distress. The patient is cooperative, eloquent and well groomed. Head: Normocephalic, atraumatic. Neck is supple. Mallampati 2  neck circumference:15. 25 ". Nasal airflow patent , Retrognathia is not seen.  Cardiovascular:   without distended neck veins. Respiratory: patient held his breath for 35 seconds.  Skin:  Without evidence of edema, or rash Trunk: BMI is normal- 25. The patient's posture is erect  Neurologic exam : The patient is awake and alert, oriented to place and time.    Attention span & concentration ability appears normal.  Speech is fluent,  without dysarthria, dysphonia or aphasia.  Mood and affect are appropriate.  Cranial nerves: Pupils are equal and briskly reactive to light. Extraocular movements  in vertical and horizontal planes intact and without nystagmus.  Facial motor strength is symmetric and tongue and uvula move midline. Shoulder shrug was  symmetrical.   Motor exam:  symmetric ROM in all extremities.  Sensory:  Fine touch, pinprick and vibration were deferred.  Coordination: Rapid alternating movements in the fingers/hands  normal without evidence of ataxia, dysmetria or tremor.  Gait and station: Patient walks without assistive device, turns with 3 steps.  . Strength within normal limits, knee felx , hip felx, adductor and abductor. .  Stance is stable and of normal base .     Assessment and Plan:  Main concern is constant moving in sleep, tossing, turning, and the irresistible need to continue movement in order to go to sleep.   He uses Ambien daily for 2-3 years now.   There has been some snoring witnessed by his spouse when he resumes the supine sleep position. He has never choked or felt any breath holding, gasping. He has not woken with a dry mouth.   Follow Up Instructions: an attended sleep study is not needed. Sleep quality has significantly improved.  spouse reported decreased PLMs. Continue CPAP use, I will be happy to follow every 12 month.  I discussed the assessment and treatment plan with the patient. The patient was provided an opportunity to ask questions and all were answered. The patient agreed with the plan and demonstrated an understanding of the instructions.   The patient was advised to call back or seek an in-person evaluation if the symptoms worsen or if the condition fails to improve as anticipated.  I provided 20 minutes of -face-to-face time during this encounter.    Larey Seat, MD    Larey Seat, MD [id number], [id number] PM  Certified in Neurology by ABPN Certified in Sleep Medicine by [facility] [address], [place],  [address]

## 2019-02-26 DIAGNOSIS — G4733 Obstructive sleep apnea (adult) (pediatric): Secondary | ICD-10-CM | POA: Diagnosis not present

## 2019-03-28 DIAGNOSIS — G4733 Obstructive sleep apnea (adult) (pediatric): Secondary | ICD-10-CM | POA: Diagnosis not present

## 2019-03-29 DIAGNOSIS — R69 Illness, unspecified: Secondary | ICD-10-CM | POA: Diagnosis not present

## 2019-04-10 DIAGNOSIS — G4733 Obstructive sleep apnea (adult) (pediatric): Secondary | ICD-10-CM | POA: Diagnosis not present

## 2019-04-12 DIAGNOSIS — R69 Illness, unspecified: Secondary | ICD-10-CM | POA: Diagnosis not present

## 2019-04-17 DIAGNOSIS — G4733 Obstructive sleep apnea (adult) (pediatric): Secondary | ICD-10-CM | POA: Diagnosis not present

## 2019-04-19 DIAGNOSIS — R69 Illness, unspecified: Secondary | ICD-10-CM | POA: Diagnosis not present

## 2019-04-28 DIAGNOSIS — G4733 Obstructive sleep apnea (adult) (pediatric): Secondary | ICD-10-CM | POA: Diagnosis not present

## 2019-05-10 DIAGNOSIS — R69 Illness, unspecified: Secondary | ICD-10-CM | POA: Diagnosis not present

## 2019-05-24 DIAGNOSIS — R69 Illness, unspecified: Secondary | ICD-10-CM | POA: Diagnosis not present

## 2019-05-28 DIAGNOSIS — G4733 Obstructive sleep apnea (adult) (pediatric): Secondary | ICD-10-CM | POA: Diagnosis not present

## 2019-06-14 DIAGNOSIS — R69 Illness, unspecified: Secondary | ICD-10-CM | POA: Diagnosis not present

## 2019-06-28 DIAGNOSIS — G4733 Obstructive sleep apnea (adult) (pediatric): Secondary | ICD-10-CM | POA: Diagnosis not present

## 2019-07-10 DIAGNOSIS — H04123 Dry eye syndrome of bilateral lacrimal glands: Secondary | ICD-10-CM | POA: Diagnosis not present

## 2019-07-10 DIAGNOSIS — H524 Presbyopia: Secondary | ICD-10-CM | POA: Diagnosis not present

## 2019-07-10 DIAGNOSIS — H5203 Hypermetropia, bilateral: Secondary | ICD-10-CM | POA: Diagnosis not present

## 2019-07-11 DIAGNOSIS — G4733 Obstructive sleep apnea (adult) (pediatric): Secondary | ICD-10-CM | POA: Diagnosis not present

## 2019-07-12 DIAGNOSIS — R69 Illness, unspecified: Secondary | ICD-10-CM | POA: Diagnosis not present

## 2019-07-29 DIAGNOSIS — G4733 Obstructive sleep apnea (adult) (pediatric): Secondary | ICD-10-CM | POA: Diagnosis not present

## 2019-08-09 DIAGNOSIS — R69 Illness, unspecified: Secondary | ICD-10-CM | POA: Diagnosis not present

## 2019-08-12 DIAGNOSIS — G4733 Obstructive sleep apnea (adult) (pediatric): Secondary | ICD-10-CM | POA: Diagnosis not present

## 2019-08-15 ENCOUNTER — Ambulatory Visit: Payer: 59

## 2019-08-16 DIAGNOSIS — R69 Illness, unspecified: Secondary | ICD-10-CM | POA: Diagnosis not present

## 2019-08-26 DIAGNOSIS — G4733 Obstructive sleep apnea (adult) (pediatric): Secondary | ICD-10-CM | POA: Diagnosis not present

## 2019-09-26 DIAGNOSIS — G4733 Obstructive sleep apnea (adult) (pediatric): Secondary | ICD-10-CM | POA: Diagnosis not present

## 2019-10-26 DIAGNOSIS — G4733 Obstructive sleep apnea (adult) (pediatric): Secondary | ICD-10-CM | POA: Diagnosis not present

## 2019-11-06 DIAGNOSIS — E7849 Other hyperlipidemia: Secondary | ICD-10-CM | POA: Diagnosis not present

## 2019-11-06 DIAGNOSIS — Z0000 Encounter for general adult medical examination without abnormal findings: Secondary | ICD-10-CM | POA: Diagnosis not present

## 2019-11-06 DIAGNOSIS — R7301 Impaired fasting glucose: Secondary | ICD-10-CM | POA: Diagnosis not present

## 2019-11-06 DIAGNOSIS — Z125 Encounter for screening for malignant neoplasm of prostate: Secondary | ICD-10-CM | POA: Diagnosis not present

## 2019-11-13 DIAGNOSIS — E7849 Other hyperlipidemia: Secondary | ICD-10-CM | POA: Diagnosis not present

## 2019-11-13 DIAGNOSIS — R82998 Other abnormal findings in urine: Secondary | ICD-10-CM | POA: Diagnosis not present

## 2019-12-17 DIAGNOSIS — G4733 Obstructive sleep apnea (adult) (pediatric): Secondary | ICD-10-CM | POA: Diagnosis not present

## 2020-01-17 DIAGNOSIS — G4733 Obstructive sleep apnea (adult) (pediatric): Secondary | ICD-10-CM | POA: Diagnosis not present

## 2020-02-07 DIAGNOSIS — R69 Illness, unspecified: Secondary | ICD-10-CM | POA: Diagnosis not present

## 2020-02-07 DIAGNOSIS — K7460 Unspecified cirrhosis of liver: Secondary | ICD-10-CM | POA: Diagnosis not present

## 2020-02-17 DIAGNOSIS — G4733 Obstructive sleep apnea (adult) (pediatric): Secondary | ICD-10-CM | POA: Diagnosis not present

## 2020-02-24 ENCOUNTER — Ambulatory Visit: Payer: 59 | Admitting: Family Medicine

## 2020-03-14 DIAGNOSIS — Z23 Encounter for immunization: Secondary | ICD-10-CM | POA: Diagnosis not present

## 2020-03-19 ENCOUNTER — Ambulatory Visit (INDEPENDENT_AMBULATORY_CARE_PROVIDER_SITE_OTHER): Payer: 59 | Admitting: Family Medicine

## 2020-03-19 ENCOUNTER — Encounter: Payer: Self-pay | Admitting: Family Medicine

## 2020-03-19 VITALS — BP 128/82 | HR 56 | Ht 75.0 in | Wt 204.2 lb

## 2020-03-19 DIAGNOSIS — Z9989 Dependence on other enabling machines and devices: Secondary | ICD-10-CM

## 2020-03-19 DIAGNOSIS — G4733 Obstructive sleep apnea (adult) (pediatric): Secondary | ICD-10-CM | POA: Diagnosis not present

## 2020-03-19 NOTE — Patient Instructions (Signed)
I recommend talking with your dentist regarding an oral appliance   Follow up as needed   Sleep Apnea Sleep apnea affects breathing during sleep. It causes breathing to stop for a short time or to become shallow. It can also increase the risk of:  Heart attack.  Stroke.  Being very overweight (obese).  Diabetes.  Heart failure.  Irregular heartbeat. The goal of treatment is to help you breathe normally again. What are the causes? There are three kinds of sleep apnea:  Obstructive sleep apnea. This is caused by a blocked or collapsed airway.  Central sleep apnea. This happens when the brain does not send the right signals to the muscles that control breathing.  Mixed sleep apnea. This is a combination of obstructive and central sleep apnea. The most common cause of this condition is a collapsed or blocked airway. This can happen if:  Your throat muscles are too relaxed.  Your tongue and tonsils are too large.  You are overweight.  Your airway is too small. What increases the risk?  Being overweight.  Smoking.  Having a small airway.  Being older.  Being male.  Drinking alcohol.  Taking medicines to calm yourself (sedatives or tranquilizers).  Having family members with the condition. What are the signs or symptoms?  Trouble staying asleep.  Being sleepy or tired during the day.  Getting angry a lot.  Loud snoring.  Headaches in the morning.  Not being able to focus your mind (concentrate).  Forgetting things.  Less interest in sex.  Mood swings.  Personality changes.  Feelings of sadness (depression).  Waking up a lot during the night to pee (urinate).  Dry mouth.  Sore throat. How is this diagnosed?  Your medical history.  A physical exam.  A test that is done when you are sleeping (sleep study). The test is most often done in a sleep lab but may also be done at home. How is this treated?   Sleeping on your side.  Using a  medicine to get rid of mucus in your nose (decongestant).  Avoiding the use of alcohol, medicines to help you relax, or certain pain medicines (narcotics).  Losing weight, if needed.  Changing your diet.  Not smoking.  Using a machine to open your airway while you sleep, such as: ? An oral appliance. This is a mouthpiece that shifts your lower jaw forward. ? A CPAP device. This device blows air through a mask when you breathe out (exhale). ? An EPAP device. This has valves that you put in each nostril. ? A BPAP device. This device blows air through a mask when you breathe in (inhale) and breathe out.  Having surgery if other treatments do not work. It is important to get treatment for sleep apnea. Without treatment, it can lead to:  High blood pressure.  Coronary artery disease.  In men, not being able to have an erection (impotence).  Reduced thinking ability. Follow these instructions at home: Lifestyle  Make changes that your doctor recommends.  Eat a healthy diet.  Lose weight if needed.  Avoid alcohol, medicines to help you relax, and some pain medicines.  Do not use any products that contain nicotine or tobacco, such as cigarettes, e-cigarettes, and chewing tobacco. If you need help quitting, ask your doctor. General instructions  Take over-the-counter and prescription medicines only as told by your doctor.  If you were given a machine to use while you sleep, use it only as told by your  doctor.  If you are having surgery, make sure to tell your doctor you have sleep apnea. You may need to bring your device with you.  Keep all follow-up visits as told by your doctor. This is important. Contact a doctor if:  The machine that you were given to use during sleep bothers you or does not seem to be working.  You do not get better.  You get worse. Get help right away if:  Your chest hurts.  You have trouble breathing in enough air.  You have an uncomfortable  feeling in your back, arms, or stomach.  You have trouble talking.  One side of your body feels weak.  A part of your face is hanging down. These symptoms may be an emergency. Do not wait to see if the symptoms will go away. Get medical help right away. Call your local emergency services (911 in the [place] Do not drive yourself to the hospital. Summary  This condition affects breathing during sleep.  The most common cause is a collapsed or blocked airway.  The goal of treatment is to help you breathe normally while you sleep. This information is not intended to replace advice given to you by your health care provider. Make sure you discuss any questions you have with your health care provider. Document Revised: 03/09/2018 Document Reviewed: 01/16/2018 Elsevier Patient Education  [place].

## 2020-03-19 NOTE — Progress Notes (Signed)
PATIENT: Javier Hart DOB: Dec 10, 1965  REASON FOR VISIT: follow up HISTORY FROM: patient  Chief Complaint  Patient presents with  . Follow-up    64yr f/u for OSA, states he has been sleeping on and off with machine for the past 4 months.   . room 2    alone     HISTORY OF PRESENT ILLNESS: Today 03/19/20 Javier Hart is a 54 y.o. male here today for follow up for OSA on CPAP.  Javier Hart admits that he continues to have difficulty with 4-hour compliance.  He does try to use his CPAP nearly every night but has become more frustrated.  He does not feel any benefit of using CPAP therapy.  In fact, he feels that he is more tired when he tries to use CPAP.  He is sleeping well.  He feels that restless leg symptoms have improved with time.  He has a difficult time adjusting to supplies after replacing.  HST in July 2020 showed mild OSA with AHI 5.6 and REM/supine AHI of 8.1.  No hypoxemia noted.  Compliance report dated 01/18/2020-03/17/2020 reveals he used CPAP 45 of the past 60 days for compliance of 75%.  He used CPAP greater than 4 hours 9 of the past 60 days for compliance of 15%.  Average usage on days used was 2 hours and 33 minutes.  Residual AHI was 1.6 on 5 to 12 cm of water and an EPR of 3.  There was no significant leak noted.   HISTORY: (copied from Dr Dohmeier's note on 02/20/2019)   Javier Hart is a 54 y.o. male seen in a sleep study follow up, 02-20-2019.   Sleep study result from [place] on 12-12-2018, Mild OSA at AHI 5.6 /h with bradycardia, but without hypoxemia noted. Exacerbated in supine sleep position to AHI of 8.8/h and in REM sleep to 8.6/h. There is loud snoring noted ( RDI) .     I recommend treatment of mild apnea for hypersomnia patients but the HST fails to document the patient's main clinical concern -his complex movements, possibly enactment of dreams, his reported PLMs. I doubt that treatment of such mild apnea is effective therapy for the above named symptoms, aside from  hypersomnia. By AASM guidelines I will have to initiate treatment of apnea first, re-evaluate the patient after 30 days by Epworth Sleepiness Score and then follow with an attended sleep study if symptoms persist. I have ordered an auto titration capable CPAP device.   The patient has since used auto CPAP- we are meeting today to see if an attended sleep study is still in consideration.  His spouse, Javier Hart,  has noted much less tossing and turning, elimination of snoring, and correction of apnea. He feels better rested, less sleepy and has more energy in daytime. He has noted the effect on alcohol on his sleep pattern and sleep quality.  The patient's compliance record is excellent he has used the machine 30 out of 30 days and 25 days over 4 hours consecutively, the average user time for all days of the last 30 days was 6 hours and 12 minutes, is using an AutoSet air sense 10 by [employer].  The minimum pressure is 5 the maximum pressure of 12 cmH2O the 3 cm expiratory pressure relief 95th percentile pressure is 7.4 which is well covered in the current settings residual AHI is only 1.4.  There are equal central and obstructive apneas noted in the residual apnea index.  Air leak is mild  to moderate, nasal pillow.    HPI:  Javier Hart is a 54 y.o. male , seen here  in a referral by video, on 10-17-2018 - upon referral by  Dr. Joylene Draft for PLMs/ RLS.   Chief complaint according to patient : The patient and his spouse Javier Hart feel that he is a restless sleeper, constantly moving incessantly kicking or as his partner describes it, "dancing" while asleep.  Sleep /medical history Javier Hart is a 54 year old Caucasian right-handed gentleman who appears younger than his noted age.  Carries a diagnosis of hypertension, probable periodic limb movements, recurrent coughing, hepatitis B followed at [facility], abnormal liver function test, dermatitis, insomnia.  He reports having a history of lower back pain and sciatica and a left  foot injury that left him with nerve damage and numbness and dysesthesia.  He used to be followed by Dr. Ernestene Kiel, he had been evaluated for fainting spells and in the past underwent tilt table testing in 2004 through 2005, dx as vasovagal-  he was overweight for a while but in 2018 lost enough to achieve a body mass index under 25.   He suffered a back injury 2007 at a fundraising dinner party when he missed a step.  Myofascial massage and chiropractic treatments have helped the left extremity has occasional sciatica some numbness and the big toe could not be fully extended.  Peroneal nerve damage had been found in a nerve conduction test in the past.  Remarkably is that the patient has no hyperlipidemia, no diabetes, no hypertension, no dry eye or dry mouth issue.  He reports some excessive sweating but this is not related to sleep.   Family medical and sleep history: The patient's father died of prostate carcinoma with metastasis to the thorax at age 22 he was had and a history of alcohol abuse.  The mother died at age 61 with breast cancer.  The patient is the youngest of 7 children 3 boys and 3 girls he was born in both parents were well in their 56s and his oldest brother was 65.   Social history: Lives with the same gender spouse and is full-time gainfully employed.  His job is mainly local [employer] and he is in his senior leadership position- he has to be available 24/7, he reports.  He quit 13 years ago smoking he may drink alcohol 2 glasses weekly reports that he made a conscious effort to drink less while  the coronavirus self-isolation started. He drinks coffee in the morning maybe 2 sometimes 3 cups but no soda, no tea and no energy drinks.  He does not consume caffeinated beverages after lunch.    Sleep habits are as follows:  Shares a bedroom with her spouse, who noted kicking, constant movements turning and tossing for the last 6 or 7 years maybe longer.  His spouse also thinks  that alcohol made movements worse at night.  The patient reports that his dinnertime is between 630 and 7 PM, bedtime between 10 and 10:30 PM and he is usually asleep between 1030 and 10:45 PM.  He describes the marital bedroom is cool, quiet and dark and while he is quickly asleep he seems to be a very restless sleeper tossing and turning movement incessantly.  This his spouse has believed is worse when he had alcohol at dinnertime.  He takes Ambien every night for the last 4 years he prefers to sleep on the left on 2 pillows on a flat bed with a firm mattress.  His pelvis will be prone but his thorax turned to the left.  He snores loudly if he finds himself supine.  He wakes up spontaneously at 5 AM but stays in bed until 6 AM with a nocturnal sleep time of 6 maybe 6-1/2 hours.   The patient cannot recall dreaming. He does not have palpitations/ no headaches/ rarely any discomfort or pain.  She uses a bite guard at night as he chews in his sleep but he is not grinding her teeth.   He feels refreshed when he wakes up spontaneously and often experiences a certain clarity of mind when he is just awoken and we can solve problems or schedule through his day before leaving the bedroom.  He feels refreshed and restored without headaches. He is waking up early- and walks in AM, gets p at 5 AM..  He denies a dry mouth.     REVIEW OF SYSTEMS: Out of a complete 14 system review of symptoms, the patient complains only of the following symptoms, none and all other reviewed systems are negative.  ESS: 1 FSS: 19  ALLERGIES: Allergies  Allergen Reactions  . Penicillins Anaphylaxis    As a child    HOME MEDICATIONS: Outpatient Medications Prior to Visit  Medication Sig Dispense Refill  . Cholecalciferol (VITAMIN D) 2000 UNITS CAPS Take by mouth.    . rosuvastatin (CRESTOR) 20 MG tablet Take 20 mg by mouth daily.    Marland Kitchen tenofovir (VIREAD) 300 MG tablet Take by mouth.    . zolpidem (AMBIEN) 10 MG tablet Take  10 mg by mouth at bedtime as needed for sleep.     Facility-Administered Medications Prior to Visit  Medication Dose Route Frequency Provider Last Rate Last Admin  . 0.9 %  sodium chloride infusion  500 mL Intravenous Once Javier Mayer, MD        PAST MEDICAL HISTORY: Past Medical History:  Diagnosis Date  . Abnormal liver function test   . Allergy   . Back injury   . Chicken pox    twice  . Depression 2005  . Hepatitis B    not active   . Hyperlipidemia   . Hypertension    past hx- weight loss  . Obesity   . Personal history of colonic adenoma 03/11/2013  . Sciatica    LLE    PAST SURGICAL HISTORY: Past Surgical History:  Procedure Laterality Date  . COLONOSCOPY    . MOUTH SURGERY     age 39  . POLYPECTOMY      FAMILY HISTORY: Family History  Problem Relation Age of Onset  . Lung cancer Father   . Prostate cancer Father   . Alcoholism Father   . Breast cancer Mother        metastatic  . Bladder Cancer Brother   . Colon cancer Neg Hx   . Colon polyps Neg Hx   . Esophageal cancer Neg Hx   . Rectal cancer Neg Hx   . Stomach cancer Neg Hx     SOCIAL HISTORY: Social History   Socioeconomic History  . Marital status: Single    Spouse name: Not on file  . Number of children: Not on file  . Years of education: Not on file  . Highest education level: Not on file  Occupational History  . Not on file  Tobacco Use  . Smoking status: Former Research scientist (life sciences)  . Smokeless tobacco: Never Used  Vaping Use  . Vaping Use: Never used  Substance  and Sexual Activity  . Alcohol use: Yes    Comment: wine and brown liquors in moderation  . Drug use: No  . Sexual activity: Not on file  Other Topics Concern  . 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 Brother is in internal medicine physician in [place].   No children.   Up to one alcoholic beverage a day, 2 caffeinated beverages daily   Social  Determinants of Health   Financial Resource Strain:   . Difficulty of Paying Living Expenses: Not on file  Food Insecurity:   . Worried About Charity fundraiser in the Last Year: Not on file  . Ran Out of Food in the Last Year: Not on file  Transportation Needs:   . Lack of Transportation (Medical): Not on file  . Lack of Transportation (Non-Medical): Not on file  Physical Activity:   . Days of Exercise per Week: Not on file  . Minutes of Exercise per Session: Not on file  Stress:   . Feeling of Stress : Not on file  Social Connections:   . Frequency of Communication with Friends and Family: Not on file  . Frequency of Social Gatherings with Friends and Family: Not on file  . Attends Religious Services: Not on file  . Active Member of Clubs or Organizations: Not on file  . Attends Archivist Meetings: Not on file  . Marital Status: Not on file  Intimate Partner Violence:   . Fear of Current or Ex-Partner: Not on file  . Emotionally Abused: Not on file  . Physically Abused: Not on file  . Sexually Abused: Not on file     PHYSICAL EXAM  Vitals:   03/19/20 1456  BP: 128/82  Pulse: (!) 56  Weight: 204 lb 3.2 oz (92.6 kg)  Height: 6\' 3"  (1.905 m)   Body mass index is 25.52 kg/m.  Generalized: Well developed, in no acute distress  Cardiology: normal rate and rhythm, no murmur noted Respiratory: clear to auscultation bilaterally  Neurological examination  Mentation: Alert oriented to time, place, history taking. Follows all commands speech and language fluent Cranial nerve II-XII: Pupils were equal round reactive to light. Extraocular movements were full, visual field were full  Motor: The motor testing reveals 5 over 5 strength of all 4 extremities. Good symmetric motor tone is noted throughout.  Gait and station: Gait is normal.    DIAGNOSTIC DATA (LABS, IMAGING, TESTING) - I reviewed patient records, labs, notes, testing and imaging myself where  available.  No flowsheet data found.   No results found for: WBC, HGB, HCT, MCV, PLT No results found for: NA, K, CL, CO2, GLUCOSE, BUN, CREATININE, CALCIUM, PROT, ALBUMIN, AST, ALT, ALKPHOS, BILITOT, GFRNONAA, GFRAA No results found for: CHOL, HDL, LDLCALC, LDLDIRECT, TRIG, CHOLHDL No results found for: HGBA1C No results found for: VITAMINB12 No results found for: TSH   ASSESSMENT AND PLAN 54 y.o. year old male  has a past medical history of Abnormal liver function test, Allergy, Back injury, Chicken pox, Depression (2005), Hepatitis B, Hyperlipidemia, Hypertension, Obesity, Personal history of colonic adenoma (03/11/2013), and Sciatica. here with     ICD-10-CM   1. OSA on CPAP  G47.33    Z99.89      Edd Reppert continues to struggle with meeting 4 hour compliance with CPAP therapy.  We have discussed multiple other treatment options including a dental appliance and inspire device.  He  is wearing a mouthguard at night for TMJ.  He wishes to speak to his dentist to see if he may be a candidate for an oral appliance that will also treat TMJ.  Should he decide to resume CPAP,  he was encouraged work on using CPAP nightly and for greater than 4 hours each night. We will update supply orders as indicated. Risks of untreated sleep apnea review and education materials provided. Healthy lifestyle habits encouraged. He will follow up as needed. He verbalizes understanding and agreement with this plan.    No orders of the defined types were placed in this encounter.    No orders of the defined types were placed in this encounter.     I spent 15 minutes with the patient. 50% of this time was spent counseling and educating patient on plan of care and medications.    Debbora Presto, FNP-C 03/19/2020, 3:40 PM [facility] [address], [place], [place] [address] [phone]

## 2020-05-22 DIAGNOSIS — G4733 Obstructive sleep apnea (adult) (pediatric): Secondary | ICD-10-CM | POA: Diagnosis not present

## 2020-06-22 DIAGNOSIS — G4733 Obstructive sleep apnea (adult) (pediatric): Secondary | ICD-10-CM | POA: Diagnosis not present

## 2020-07-23 DIAGNOSIS — G4733 Obstructive sleep apnea (adult) (pediatric): Secondary | ICD-10-CM | POA: Diagnosis not present

## 2020-08-14 DIAGNOSIS — K7460 Unspecified cirrhosis of liver: Secondary | ICD-10-CM | POA: Diagnosis not present

## 2020-08-14 DIAGNOSIS — R69 Illness, unspecified: Secondary | ICD-10-CM | POA: Diagnosis not present

## 2020-09-02 DIAGNOSIS — L218 Other seborrheic dermatitis: Secondary | ICD-10-CM | POA: Diagnosis not present

## 2020-09-02 DIAGNOSIS — L300 Nummular dermatitis: Secondary | ICD-10-CM | POA: Diagnosis not present

## 2021-10-07 DIAGNOSIS — B181 Chronic viral hepatitis B without delta-agent: Secondary | ICD-10-CM | POA: Diagnosis not present

## 2021-10-07 DIAGNOSIS — Z79899 Other long term (current) drug therapy: Secondary | ICD-10-CM | POA: Diagnosis not present

## 2022-01-12 DIAGNOSIS — G4733 Obstructive sleep apnea (adult) (pediatric): Secondary | ICD-10-CM | POA: Diagnosis not present

## 2022-02-12 DIAGNOSIS — G4733 Obstructive sleep apnea (adult) (pediatric): Secondary | ICD-10-CM | POA: Diagnosis not present

## 2022-03-14 DIAGNOSIS — G4733 Obstructive sleep apnea (adult) (pediatric): Secondary | ICD-10-CM | POA: Diagnosis not present

## 2022-04-11 DIAGNOSIS — B181 Chronic viral hepatitis B without delta-agent: Secondary | ICD-10-CM | POA: Diagnosis not present

## 2022-06-07 DIAGNOSIS — E785 Hyperlipidemia, unspecified: Secondary | ICD-10-CM | POA: Diagnosis not present

## 2022-06-07 DIAGNOSIS — R7301 Impaired fasting glucose: Secondary | ICD-10-CM | POA: Diagnosis not present

## 2022-06-07 DIAGNOSIS — Z125 Encounter for screening for malignant neoplasm of prostate: Secondary | ICD-10-CM | POA: Diagnosis not present

## 2022-06-08 DIAGNOSIS — Z1211 Encounter for screening for malignant neoplasm of colon: Secondary | ICD-10-CM | POA: Diagnosis not present

## 2022-06-08 DIAGNOSIS — R82998 Other abnormal findings in urine: Secondary | ICD-10-CM | POA: Diagnosis not present

## 2022-06-08 DIAGNOSIS — I10 Essential (primary) hypertension: Secondary | ICD-10-CM | POA: Diagnosis not present

## 2022-06-14 DIAGNOSIS — I10 Essential (primary) hypertension: Secondary | ICD-10-CM | POA: Diagnosis not present

## 2022-06-14 DIAGNOSIS — Z0000 Encounter for general adult medical examination without abnormal findings: Secondary | ICD-10-CM | POA: Diagnosis not present

## 2022-06-15 ENCOUNTER — Other Ambulatory Visit: Payer: Self-pay | Admitting: Internal Medicine

## 2022-06-15 DIAGNOSIS — E785 Hyperlipidemia, unspecified: Secondary | ICD-10-CM

## 2022-07-12 ENCOUNTER — Encounter: Payer: Self-pay | Admitting: Internal Medicine

## 2022-07-13 ENCOUNTER — Other Ambulatory Visit: Payer: 59

## 2022-07-19 DIAGNOSIS — H40033 Anatomical narrow angle, bilateral: Secondary | ICD-10-CM | POA: Diagnosis not present

## 2022-07-19 DIAGNOSIS — H524 Presbyopia: Secondary | ICD-10-CM | POA: Diagnosis not present

## 2022-07-19 DIAGNOSIS — H5203 Hypermetropia, bilateral: Secondary | ICD-10-CM | POA: Diagnosis not present

## 2022-07-25 DIAGNOSIS — R972 Elevated prostate specific antigen [PSA]: Secondary | ICD-10-CM | POA: Diagnosis not present

## 2022-08-04 DIAGNOSIS — K080 Exfoliation of teeth due to systemic causes: Secondary | ICD-10-CM | POA: Diagnosis not present

## 2022-08-16 ENCOUNTER — Ambulatory Visit
Admission: RE | Admit: 2022-08-16 | Discharge: 2022-08-16 | Disposition: A | Discharge status: Home / Self Care | Payer: No Typology Code available for payment source | Source: Ambulatory Visit | Attending: Internal Medicine | Admitting: Internal Medicine

## 2022-08-16 DIAGNOSIS — G4733 Obstructive sleep apnea (adult) (pediatric): Secondary | ICD-10-CM | POA: Diagnosis not present

## 2022-08-16 DIAGNOSIS — E785 Hyperlipidemia, unspecified: Secondary | ICD-10-CM

## 2022-08-16 DIAGNOSIS — I2510 Atherosclerotic heart disease of native coronary artery without angina pectoris: Secondary | ICD-10-CM | POA: Diagnosis not present

## 2022-09-16 DIAGNOSIS — G4733 Obstructive sleep apnea (adult) (pediatric): Secondary | ICD-10-CM | POA: Diagnosis not present

## 2022-10-12 DIAGNOSIS — C220 Liver cell carcinoma: Secondary | ICD-10-CM | POA: Diagnosis not present

## 2022-10-12 DIAGNOSIS — B181 Chronic viral hepatitis B without delta-agent: Secondary | ICD-10-CM | POA: Diagnosis not present

## 2022-10-16 DIAGNOSIS — G4733 Obstructive sleep apnea (adult) (pediatric): Secondary | ICD-10-CM | POA: Diagnosis not present

## 2022-10-18 DIAGNOSIS — B181 Chronic viral hepatitis B without delta-agent: Secondary | ICD-10-CM | POA: Diagnosis not present

## 2022-11-25 DIAGNOSIS — E785 Hyperlipidemia, unspecified: Secondary | ICD-10-CM | POA: Diagnosis not present

## 2023-01-11 DIAGNOSIS — G4733 Obstructive sleep apnea (adult) (pediatric): Secondary | ICD-10-CM | POA: Diagnosis not present

## 2023-01-23 DIAGNOSIS — R972 Elevated prostate specific antigen [PSA]: Secondary | ICD-10-CM | POA: Diagnosis not present

## 2023-01-30 DIAGNOSIS — R972 Elevated prostate specific antigen [PSA]: Secondary | ICD-10-CM | POA: Diagnosis not present

## 2023-02-10 ENCOUNTER — Other Ambulatory Visit: Payer: Self-pay | Admitting: Urology

## 2023-02-10 DIAGNOSIS — R972 Elevated prostate specific antigen [PSA]: Secondary | ICD-10-CM

## 2023-02-27 ENCOUNTER — Ambulatory Visit (INDEPENDENT_AMBULATORY_CARE_PROVIDER_SITE_OTHER): Payer: BC Managed Care – PPO | Admitting: Podiatry

## 2023-02-27 ENCOUNTER — Encounter: Payer: Self-pay | Admitting: Podiatry

## 2023-02-27 DIAGNOSIS — M216X9 Other acquired deformities of unspecified foot: Secondary | ICD-10-CM | POA: Diagnosis not present

## 2023-02-27 DIAGNOSIS — B351 Tinea unguium: Secondary | ICD-10-CM | POA: Diagnosis not present

## 2023-02-27 NOTE — Progress Notes (Signed)
Subjective:   Patient ID: Javier Hart, male   DOB: 57 y.o.   MRN: [id number]   HPI Patient presents with a line in his right big toenail for several months and is a walker and is on his feet a lot and just was concerned about his left he does have a history of foot drop that he seems to recover from but wants checked.  Patient does not smoke likes to be active   Review of Systems  All other systems reviewed and are negative.       Objective:  Physical Exam Vitals and nursing note reviewed.  Constitutional:      Appearance: He is well-developed.  Pulmonary:     Effort: Pulmonary effort is normal.  Musculoskeletal:        General: Normal range of motion.  Skin:    General: Skin is warm.  Neurological:     Mental Status: He is alert.     Neurovascular status intact muscle strength adequate range of motion adequate with cavus foot structure and significant inversion bilateral but no indication of muscle loss left.  Right nail hallux shows a small line going up about two thirds the way through the nail on the medial side yellow in color no other pathology      Assessment:  Appears to be more of a trauma versus any other pathology with history cavus foot structure     Plan:  H&P reviewed both conditions and the nail should grow out and I then discussed the cavus do not recommend treatment unless any issues were to occur but I did tell him the types of shoes I think would be best for hiking and other activities

## 2023-03-01 DIAGNOSIS — Z23 Encounter for immunization: Secondary | ICD-10-CM | POA: Diagnosis not present

## 2023-03-07 DIAGNOSIS — M791 Myalgia, unspecified site: Secondary | ICD-10-CM | POA: Diagnosis not present

## 2023-03-07 DIAGNOSIS — I10 Essential (primary) hypertension: Secondary | ICD-10-CM | POA: Diagnosis not present

## 2023-03-07 DIAGNOSIS — I2510 Atherosclerotic heart disease of native coronary artery without angina pectoris: Secondary | ICD-10-CM | POA: Diagnosis not present

## 2023-03-26 ENCOUNTER — Ambulatory Visit
Admission: RE | Admit: 2023-03-26 | Discharge: 2023-03-26 | Disposition: A | Discharge status: Home / Self Care | Payer: BC Managed Care – PPO | Source: Ambulatory Visit | Attending: Urology | Admitting: Urology

## 2023-03-26 DIAGNOSIS — R972 Elevated prostate specific antigen [PSA]: Secondary | ICD-10-CM | POA: Diagnosis not present

## 2023-03-26 MED ORDER — GADOPICLENOL 0.5 MMOL/ML IV SOLN
9.0000 mL | Freq: Once | INTRAVENOUS | Status: AC | PRN
  Administered 2023-03-26: 9 mL via INTRAVENOUS

## 2023-04-20 DIAGNOSIS — B181 Chronic viral hepatitis B without delta-agent: Secondary | ICD-10-CM | POA: Diagnosis not present

## 2023-05-16 DIAGNOSIS — Z87891 Personal history of nicotine dependence: Secondary | ICD-10-CM | POA: Diagnosis not present

## 2023-05-16 DIAGNOSIS — Z79899 Other long term (current) drug therapy: Secondary | ICD-10-CM | POA: Diagnosis not present

## 2023-05-16 DIAGNOSIS — B181 Chronic viral hepatitis B without delta-agent: Secondary | ICD-10-CM | POA: Diagnosis not present

## 2023-06-16 DIAGNOSIS — C61 Malignant neoplasm of prostate: Secondary | ICD-10-CM | POA: Diagnosis not present

## 2023-06-16 DIAGNOSIS — N4289 Other specified disorders of prostate: Secondary | ICD-10-CM | POA: Diagnosis not present

## 2023-07-07 DIAGNOSIS — C61 Malignant neoplasm of prostate: Secondary | ICD-10-CM | POA: Diagnosis not present

## 2023-07-22 DIAGNOSIS — C61 Malignant neoplasm of prostate: Secondary | ICD-10-CM | POA: Diagnosis not present

## 2023-07-25 DIAGNOSIS — H5203 Hypermetropia, bilateral: Secondary | ICD-10-CM | POA: Diagnosis not present

## 2023-07-25 DIAGNOSIS — H40032 Anatomical narrow angle, left eye: Secondary | ICD-10-CM | POA: Diagnosis not present

## 2023-07-25 DIAGNOSIS — H524 Presbyopia: Secondary | ICD-10-CM | POA: Diagnosis not present

## 2023-08-17 DIAGNOSIS — R972 Elevated prostate specific antigen [PSA]: Secondary | ICD-10-CM | POA: Diagnosis not present

## 2023-08-17 DIAGNOSIS — R7301 Impaired fasting glucose: Secondary | ICD-10-CM | POA: Diagnosis not present

## 2023-08-17 DIAGNOSIS — E785 Hyperlipidemia, unspecified: Secondary | ICD-10-CM | POA: Diagnosis not present

## 2023-08-23 DIAGNOSIS — I10 Essential (primary) hypertension: Secondary | ICD-10-CM | POA: Diagnosis not present

## 2023-08-23 DIAGNOSIS — Z1212 Encounter for screening for malignant neoplasm of rectum: Secondary | ICD-10-CM | POA: Diagnosis not present

## 2023-08-23 DIAGNOSIS — R82998 Other abnormal findings in urine: Secondary | ICD-10-CM | POA: Diagnosis not present

## 2023-08-24 DIAGNOSIS — Z1331 Encounter for screening for depression: Secondary | ICD-10-CM | POA: Diagnosis not present

## 2023-08-24 DIAGNOSIS — I10 Essential (primary) hypertension: Secondary | ICD-10-CM | POA: Diagnosis not present

## 2023-08-24 DIAGNOSIS — Z0000 Encounter for general adult medical examination without abnormal findings: Secondary | ICD-10-CM | POA: Diagnosis not present

## 2023-08-24 DIAGNOSIS — Z1339 Encounter for screening examination for other mental health and behavioral disorders: Secondary | ICD-10-CM | POA: Diagnosis not present

## 2023-10-24 DIAGNOSIS — G4733 Obstructive sleep apnea (adult) (pediatric): Secondary | ICD-10-CM | POA: Diagnosis not present

## 2023-11-22 DIAGNOSIS — B181 Chronic viral hepatitis B without delta-agent: Secondary | ICD-10-CM | POA: Diagnosis not present

## 2024-01-15 DIAGNOSIS — C61 Malignant neoplasm of prostate: Secondary | ICD-10-CM | POA: Diagnosis not present

## 2024-01-22 DIAGNOSIS — C61 Malignant neoplasm of prostate: Secondary | ICD-10-CM | POA: Diagnosis not present

## 2024-02-27 DIAGNOSIS — Z23 Encounter for immunization: Secondary | ICD-10-CM | POA: Diagnosis not present

## 2024-05-21 ENCOUNTER — Other Ambulatory Visit: Payer: Self-pay | Admitting: Urology

## 2024-05-21 DIAGNOSIS — C61 Malignant neoplasm of prostate: Secondary | ICD-10-CM

## 2024-07-03 ENCOUNTER — Encounter: Payer: Self-pay | Admitting: Urology

## 2024-07-15 ENCOUNTER — Other Ambulatory Visit
